# Patient Record
Sex: Female | Born: 1995 | Race: Black or African American | Hispanic: No | Marital: Single | Smoking: Never smoker
Health system: Southern US, Community
[De-identification: ages and names within clinical notes are randomized; demographics above are authoritative.]

## PROBLEM LIST (undated history)

## (undated) ENCOUNTER — Inpatient Hospital Stay (HOSPITAL_COMMUNITY): Payer: Self-pay

## (undated) ENCOUNTER — Emergency Department (HOSPITAL_COMMUNITY): Payer: 59

## (undated) DIAGNOSIS — A609 Anogenital herpesviral infection, unspecified: Secondary | ICD-10-CM

## (undated) DIAGNOSIS — R569 Unspecified convulsions: Secondary | ICD-10-CM

## (undated) HISTORY — PX: WISDOM TOOTH EXTRACTION: SHX21

---
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
## 2015-04-12 ENCOUNTER — Encounter (HOSPITAL_COMMUNITY): Payer: Self-pay | Admitting: *Deleted

## 2015-04-12 ENCOUNTER — Emergency Department (HOSPITAL_COMMUNITY)
Admission: EM | Admit: 2015-04-12 | Discharge: 2015-04-12 | Disposition: A | Discharge status: Home / Self Care | Payer: 59 | Attending: Emergency Medicine | Admitting: Emergency Medicine

## 2015-04-12 DIAGNOSIS — Z8679 Personal history of other diseases of the circulatory system: Secondary | ICD-10-CM | POA: Insufficient documentation

## 2015-04-12 DIAGNOSIS — Y92410 Unspecified street and highway as the place of occurrence of the external cause: Secondary | ICD-10-CM | POA: Diagnosis not present

## 2015-04-12 DIAGNOSIS — M542 Cervicalgia: Secondary | ICD-10-CM

## 2015-04-12 DIAGNOSIS — Y9389 Activity, other specified: Secondary | ICD-10-CM | POA: Insufficient documentation

## 2015-04-12 DIAGNOSIS — S0990XA Unspecified injury of head, initial encounter: Secondary | ICD-10-CM | POA: Diagnosis not present

## 2015-04-12 DIAGNOSIS — Y998 Other external cause status: Secondary | ICD-10-CM | POA: Insufficient documentation

## 2015-04-12 DIAGNOSIS — S199XXA Unspecified injury of neck, initial encounter: Secondary | ICD-10-CM | POA: Diagnosis present
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
## 2015-04-12 NOTE — Discharge Instructions (Signed)
Cervical Sprain °A cervical sprain is an injury in the neck in which the strong, fibrous tissues (ligaments) that connect your neck bones stretch or tear. Cervical sprains can range from mild to severe. Severe cervical sprains can cause the neck vertebrae to be unstable. This can lead to damage of the spinal cord and can result in serious nervous system problems. The amount of time it takes for a cervical sprain to get better depends on the cause and extent of the injury. Most cervical sprains heal in 1 to 3 weeks. °CAUSES  °Severe cervical sprains may be caused by:  °· Contact sport injuries (such as from football, rugby, wrestling, hockey, auto racing, gymnastics, diving, martial arts, or boxing).   °· Motor vehicle collisions.   °· Whiplash injuries. This is an injury from a sudden forward and backward whipping movement of the head and neck.  °· Falls.   °Mild cervical sprains may be caused by:  °· Being in an awkward position, such as while cradling a telephone between your ear and shoulder.   °· Sitting in a chair that does not offer proper support.   °· Working at a poorly designed computer station.   °· Looking up or down for long periods of time.   °SYMPTOMS  °· Pain, soreness, stiffness, or a burning sensation in the front, back, or sides of the neck. This discomfort may develop immediately after the injury or slowly, 24 hours or more after the injury.   °· Pain or tenderness directly in the middle of the back of the neck.   °· Shoulder or upper back pain.   °· Limited ability to move the neck.   °· Headache.   °· Dizziness.   °· Weakness, numbness, or tingling in the hands or arms.   °· Muscle spasms.   °· Difficulty swallowing or chewing.   °· Tenderness and swelling of the neck.   °DIAGNOSIS  °Most of the time your health care provider can diagnose a cervical sprain by taking your history and doing a physical exam. Your health care provider will ask about previous neck injuries and any known neck  problems, such as arthritis in the neck. X-rays may be taken to find out if there are any other problems, such as with the bones of the neck. Other tests, such as a CT scan or MRI, may also be needed.  °TREATMENT  °Treatment depends on the severity of the cervical sprain. Mild sprains can be treated with rest, keeping the neck in place (immobilization), and pain medicines. Severe cervical sprains are immediately immobilized. Further treatment is done to help with pain, muscle spasms, and other symptoms and may include: °· Medicines, such as pain relievers, numbing medicines, or muscle relaxants.   °· Physical therapy. This may involve stretching exercises, strengthening exercises, and posture training. Exercises and improved posture can help stabilize the neck, strengthen muscles, and help stop symptoms from returning.   °HOME CARE INSTRUCTIONS  °· Put ice on the injured area.   °¨ Put ice in a plastic bag.   °¨ Place a towel between your skin and the bag.   °¨ Leave the ice on for 15-20 minutes, 3-4 times a day.   °· If your injury was severe, you may have been given a cervical collar to wear. A cervical collar is a two-piece collar designed to keep your neck from moving while it heals. °¨ Do not remove the collar unless instructed by your health care provider. °¨ If you have long hair, keep it outside of the collar. °¨ Ask your health care provider before making any adjustments to your collar. Minor   adjustments may be required over time to improve comfort and reduce pressure on your chin or on the back of your head.  Ifyou are allowed to remove the collar for cleaning or bathing, follow your health care provider's instructions on how to do so safely.  Keep your collar clean by wiping it with mild soap and water and drying it completely. If the collar you have been given includes removable pads, remove them every 1-2 days and hand wash them with soap and water. Allow them to air dry. They should be completely  dry before you wear them in the collar.  If you are allowed to remove the collar for cleaning and bathing, wash and dry the skin of your neck. Check your skin for irritation or sores. If you see any, tell your health care provider.  Do not drive while wearing the collar.   Only take over-the-counter or prescription medicines for pain, discomfort, or fever as directed by your health care provider.   Keep all follow-up appointments as directed by your health care provider.   Keep all physical therapy appointments as directed by your health care provider.   Make any needed adjustments to your workstation to promote good posture.   Avoid positions and activities that make your symptoms worse.   Warm up and stretch before being active to help prevent problems.  SEEK MEDICAL CARE IF:   Your pain is not controlled with medicine.   You are unable to decrease your pain medicine over time as planned.   Your activity level is not improving as expected.  SEEK IMMEDIATE MEDICAL CARE IF:   You develop any bleeding.  You develop stomach upset.  You have signs of an allergic reaction to your medicine.   Your symptoms get worse.   You develop new, unexplained symptoms.   You have numbness, tingling, weakness, or paralysis in any part of your body.  MAKE SURE YOU:   Understand these instructions.  Will watch your condition.  Will get help right away if you are not doing well or get worse. Document Released: 08/18/2007 Document Revised: 10/26/2013 Document Reviewed: 04/28/2013 [facility] Patient Information 2015 [employer]. This information is not intended to replace advice given to you by your health care provider. Make sure you discuss any questions you have with your health care provider.  Ice, heat, Tylenol, ibuprofen can be used for pain. Please monitor for new or worsening signs or symptoms follow-up immediately if any present. If pain continues to persist past 1  week please follow-up with  and wellness for further evaluation and management

## 2015-04-12 NOTE — ED Notes (Signed)
Pt A&OX4, ambulatory at d/c with steady gait, NAD 

## 2015-04-12 NOTE — ED Notes (Signed)
PA at bedside.

## 2017-11-09 ENCOUNTER — Encounter (HOSPITAL_COMMUNITY): Payer: Self-pay | Admitting: Nurse Practitioner

## 2017-11-09 DIAGNOSIS — R112 Nausea with vomiting, unspecified: Secondary | ICD-10-CM | POA: Insufficient documentation

## 2017-11-09 LAB — COMPREHENSIVE METABOLIC PANEL
ALBUMIN: 4.6 g/dL (ref 3.5–5.0)
ALK PHOS: 63 U/L (ref 38–126)
ALT: 13 U/L — AB (ref 14–54)
AST: 17 U/L (ref 15–41)
Anion gap: 7 (ref 5–15)
BILIRUBIN TOTAL: 1.1 mg/dL (ref 0.3–1.2)
BUN: 7 mg/dL (ref 6–20)
CALCIUM: 9.3 mg/dL (ref 8.9–10.3)
CO2: 24 mmol/L (ref 22–32)
CREATININE: 0.54 mg/dL (ref 0.44–1.00)
Chloride: 107 mmol/L (ref 101–111)
GFR calc Af Amer: >60 mL/min (ref >60)
GFR calc non Af Amer: >60 mL/min (ref >60)
GLUCOSE: 103 mg/dL — AB (ref 65–99)
Potassium: 3.6 mmol/L (ref 3.5–5.1)
Sodium: 138 mmol/L (ref 135–145)
TOTAL PROTEIN: 8 g/dL (ref 6.5–8.1)

## 2017-11-09 LAB — URINALYSIS, ROUTINE W REFLEX MICROSCOPIC
Bilirubin Urine: NEGATIVE
Glucose, UA: NEGATIVE mg/dL
Ketones, ur: 20 mg/dL — AB
Leukocytes, UA: NEGATIVE
NITRITE: NEGATIVE
PH: 5 (ref 5.0–8.0)
Protein, ur: 100 mg/dL — AB
SPECIFIC GRAVITY, URINE: 1.027 (ref 1.005–1.030)

## 2017-11-09 LAB — CBC
HEMATOCRIT: 39 % (ref 36.0–46.0)
Hemoglobin: 13.3 g/dL (ref 12.0–15.0)
MCH: 30.2 pg (ref 26.0–34.0)
MCHC: 34.1 g/dL (ref 30.0–36.0)
MCV: 88.6 fL (ref 78.0–100.0)
PLATELETS: 289 10*3/uL (ref 150–400)
RBC: 4.4 MIL/uL (ref 3.87–5.11)
RDW: 13.9 % (ref 11.5–15.5)
WBC: 13.7 10*3/uL — AB (ref 4.0–10.5)

## 2017-11-09 LAB — I-STAT BETA HCG BLOOD, ED (MC, WL, AP ONLY)

## 2017-11-09 LAB — LIPASE, BLOOD: Lipase: 23 U/L (ref 11–51)

## 2017-11-09 NOTE — ED Triage Notes (Signed)
Pt states she has been vomiting non-stop since this morning. C/o mild abdominal pain.

## 2017-11-10 ENCOUNTER — Emergency Department (HOSPITAL_COMMUNITY)
Admission: EM | Admit: 2017-11-10 | Discharge: 2017-11-10 | Disposition: A | Discharge status: Home / Self Care | Payer: 59 | Attending: Emergency Medicine | Admitting: Emergency Medicine

## 2017-11-10 ENCOUNTER — Other Ambulatory Visit: Payer: Self-pay

## 2017-11-10 DIAGNOSIS — R112 Nausea with vomiting, unspecified: Secondary | ICD-10-CM

## 2017-11-10 MED ORDER — ONDANSETRON 4 MG PO TBDP
4.0000 mg | ORAL_TABLET | Freq: Once | ORAL | Status: AC
  Administered 2017-11-10: 4 mg via ORAL
  Filled 2017-11-10: qty 1

## 2017-11-10 MED ORDER — ONDANSETRON HCL 4 MG PO TABS: 4.0000 mg | ORAL_TABLET | Freq: Four times a day (QID) | ORAL | 0 refills | Status: DC

## 2017-11-10 NOTE — Discharge Instructions (Signed)
Please read attached information. If you experience any new or worsening signs or symptoms please return to the emergency room for evaluation. Please follow-up with your primary care provider or specialist as discussed. Please use medication prescribed only as directed and discontinue taking if you have any concerning signs or symptoms.   °

## 2017-11-10 NOTE — ED Provider Notes (Signed)
[facility]-EMERGENCY DEPT Provider Note   CSN: [id number] Arrival date & time: 11/09/17  1617     History   Chief Complaint Chief Complaint  Patient presents with  . Emesis    HPI Kristen Reese is a 22 y.o. female.  HPI   22 year old female presents today with complaints of nausea and vomiting.  Patient notes she woke up this morning with ongoing vomiting.  She denies any blood, denies any significant abdominal pain, or fever.  Patient notes she is on her menstrual cycle and has having very minor lower abdominal cramping, she notes this is typical of her menstrual cycles.  She denies any diarrhea, denies any abnormal food or drink.  Patient reports she is otherwise healthy.  Unable to tolerate p.o. prior to arrival.   Past Medical History:  Diagnosis Date  . Migraine     There are no active problems to display for this patient.   Past Surgical History:  Procedure Laterality Date  . WISDOM TOOTH EXTRACTION      OB History    Gravida Para Term Preterm AB Living   1 0 0 0 0 0   SAB TAB Ectopic Multiple Live Births   0 0 0 0         Home Medications    Prior to Admission medications   Medication Sig Start Date End Date Taking? Authorizing Provider  ondansetron (ZOFRAN) 4 MG tablet Take 1 tablet (4 mg total) by mouth every 6 (six) hours. 11/10/17   Eyvonne MechanicHedges, Theressa Piedra, PA-C    Family History History reviewed. No pertinent family history.  Social History Social History   Tobacco Use  . Smoking status: Never Smoker  . Smokeless tobacco: Never Used  Substance Use Topics  . Alcohol use: No  . Drug use: No     Allergies   Patient has no known allergies.   Review of Systems Review of Systems  All other systems reviewed and are negative.    Physical Exam Updated Vital Signs BP (!) 136/93 (BP Location: Left Arm)   Pulse 78   Temp 98.4 F (36.9 C) (Oral)   Resp 20   LMP 11/09/2017   SpO2 99%   Physical Exam  Constitutional:  She is oriented to person, place, and time. She appears well-developed and well-nourished.  HENT:  Head: Normocephalic and atraumatic.  Eyes: Conjunctivae are normal. Pupils are equal, round, and reactive to light. Right eye exhibits no discharge. Left eye exhibits no discharge. No scleral icterus.  Neck: Normal range of motion. No JVD present. No tracheal deviation present.  Pulmonary/Chest: Effort normal. No stridor.  Abdominal: Soft. Bowel sounds are normal. She exhibits no distension and no mass. There is no tenderness. There is no rebound and no guarding. No hernia.  Neurological: She is alert and oriented to person, place, and time. Coordination normal.  Psychiatric: She has a normal mood and affect. Her behavior is normal. Judgment and thought content normal.  Nursing note and vitals reviewed.    ED Treatments / Results  Labs (all labs ordered are listed, but only abnormal results are displayed) Labs Reviewed  COMPREHENSIVE METABOLIC PANEL - Abnormal; Notable for the following components:      Result Value   Glucose, Bld 103 (*)    ALT 13 (*)    All other components within normal limits  CBC - Abnormal; Notable for the following components:   WBC 13.7 (*)    All other components within normal limits  URINALYSIS, ROUTINE W REFLEX MICROSCOPIC - Abnormal; Notable for the following components:   Hgb urine dipstick LARGE (*)    Ketones, ur 20 (*)    Protein, ur 100 (*)    Bacteria, UA MANY (*)    Squamous Epithelial / LPF 0-5 (*)    All other components within normal limits  LIPASE, BLOOD  I-STAT BETA HCG BLOOD, ED ([facility], [facility], [facility] ONLY)    EKG  EKG Interpretation None       Radiology No results found.  Procedures Procedures (including critical care time)  Medications Ordered in ED Medications  ondansetron (ZOFRAN-ODT) disintegrating tablet 4 mg (4 mg Oral Given 11/10/17 0131)     Initial Impression / Assessment and Plan / ED Course  I have reviewed the triage vital  signs and the nursing notes.  Pertinent labs & imaging results that were available during my care of the patient were reviewed by me and considered in my medical decision making (see chart for details).     Final Clinical Impressions(s) / ED Diagnoses   Final diagnoses:  Non-intractable vomiting with nausea, unspecified vomiting type    Labs:   Imaging:  Consults:  Therapeutics: Zofran  Discharge Meds: Zofran  Assessment/Plan: 22 year old female presents today with nausea and vomiting.  She is very well-appearing in no acute distress.  She has soft benign nontender abdomen with no signs of significant infection.  Patient tolerating p.o., low suspicion for significant intra-abdominal pathology.  She will follow-up immediately if any new or worsening signs or symptoms present.  Patient verbalized understanding and agreement to today's plan had no further questions or concerns.      ED Discharge Orders        Ordered    ondansetron (ZOFRAN) 4 MG tablet  Every 6 hours     11/10/17 0206       Eyvonne Mechanic, PA-C 11/10/17 0208    Molpus, Jonny Ruiz, MD 11/10/17 [phone]

## 2017-11-10 NOTE — ED Notes (Signed)
Bed: WA19 Expected date:  Expected time:  Means of arrival:  Comments: 

## 2017-12-16 ENCOUNTER — Other Ambulatory Visit: Payer: Self-pay | Admitting: Family Medicine

## 2017-12-16 DIAGNOSIS — Z8669 Personal history of other diseases of the nervous system and sense organs: Secondary | ICD-10-CM

## 2017-12-23 ENCOUNTER — Encounter (HOSPITAL_COMMUNITY): Payer: Self-pay | Admitting: Emergency Medicine

## 2017-12-23 ENCOUNTER — Ambulatory Visit (HOSPITAL_COMMUNITY)
Admission: EM | Admit: 2017-12-23 | Discharge: 2017-12-23 | Disposition: A | Discharge status: Home / Self Care | Payer: 59 | Attending: Internal Medicine | Admitting: Internal Medicine

## 2017-12-23 DIAGNOSIS — R1084 Generalized abdominal pain: Secondary | ICD-10-CM

## 2017-12-23 DIAGNOSIS — R112 Nausea with vomiting, unspecified: Secondary | ICD-10-CM | POA: Diagnosis not present

## 2017-12-23 DIAGNOSIS — Z3202 Encounter for pregnancy test, result negative: Secondary | ICD-10-CM

## 2017-12-23 DIAGNOSIS — N946 Dysmenorrhea, unspecified: Secondary | ICD-10-CM | POA: Diagnosis not present

## 2017-12-23 LAB — POCT URINALYSIS DIP (DEVICE)
GLUCOSE, UA: NEGATIVE mg/dL
KETONES UR: 40 mg/dL — AB
LEUKOCYTES UA: NEGATIVE
Nitrite: NEGATIVE
Protein, ur: >=300 mg/dL — AB
Urobilinogen, UA: 0.2 mg/dL (ref 0.0–1.0)
pH: 5.5 (ref 5.0–8.0)

## 2017-12-23 LAB — POCT PREGNANCY, URINE
PREG TEST UR: NEGATIVE
Preg Test, Ur: NEGATIVE

## 2017-12-23 MED ORDER — ONDANSETRON 4 MG PO TBDP
4.0000 mg | ORAL_TABLET | Freq: Once | ORAL | Status: AC
  Administered 2017-12-23: 4 mg via ORAL

## 2017-12-23 MED ORDER — ONDANSETRON 4 MG PO TBDP
ORAL_TABLET | ORAL | Status: AC
  Filled 2017-12-23: qty 1

## 2017-12-23 MED ORDER — KETOROLAC TROMETHAMINE 60 MG/2ML IM SOLN
INTRAMUSCULAR | Status: AC
  Filled 2017-12-23: qty 2

## 2017-12-23 MED ORDER — ONDANSETRON HCL 4 MG PO TABS: 4.0000 mg | ORAL_TABLET | Freq: Three times a day (TID) | ORAL | 0 refills | Status: DC | PRN

## 2017-12-23 MED ORDER — KETOROLAC TROMETHAMINE 60 MG/2ML IM SOLN
60.0000 mg | Freq: Once | INTRAMUSCULAR | Status: AC
  Administered 2017-12-23: 60 mg via INTRAMUSCULAR

## 2017-12-23 NOTE — Discharge Instructions (Signed)
Increase fluid intake tonight to ensure adequate hydration, gatorade or pedialyte will likely be beneficial for you. If symptoms remain controled tomorrow may advance to bland diet as tolerated.  Zofran every 8 hours as needed. Ibuprofen as needed for pain, do not take another dose for 6 hours from now. Please continue to follow with your primary care provider and/or gynecology as already scheduled. If worsening of symptoms or dehydration return to be seen or go to Er.

## 2017-12-23 NOTE — ED Notes (Signed)
Pt sts that she does not have to urinate do to N/V.  Pt given fluids to encourage urination.

## 2017-12-23 NOTE — ED Triage Notes (Addendum)
PT reports nausea and vomiting that started today at 4am. PT reports at least ten episodes. PT reports weakness as well.  PT is on her menstrual, this happened last month as well. PT plans to see OBGYN next week

## 2017-12-23 NOTE — ED Provider Notes (Signed)
[facility]    CSN: [id number] Arrival date & time: 12/23/17  1506     History   Chief Complaint Chief Complaint  Patient presents with  . Emesis    HPI Kristen Reese is a 22 y.o. female.   Kristen Reese presents with complaints of nausea and vomiting which started suddenly this morning at 0400. She states she has vomited >10 times today. Has not been able to drink. Has been sipping fluids but has had had vomiting following. Urinating. Abdominal cramping associated with her period which started 2/16. She states she had similar presentation with her period last month, was seen in ER 1/7. Symptoms improved with zofran. She states at that time she thought it was viral illness, but now she feels it may be related to her period. 10/10 cramping. Has not been able to take any medications for pain. No known fevers or known ill contacts. Her periods are regular. Denies diarrhea. Denies dizziness. Feels weak. Denies vaginal discharge or symptoms. Denies urinary symptoms. No known ill contacts.      ROS per HPI.       Past Medical History:  Diagnosis Date  . Migraine     There are no active problems to display for this patient.   Past Surgical History:  Procedure Laterality Date  . WISDOM TOOTH EXTRACTION      OB History    Gravida Para Term Preterm AB Living   1 0 0 0 0 0   SAB TAB Ectopic Multiple Live Births   0 0 0 0         Home Medications    Prior to Admission medications   Medication Sig Start Date End Date Taking? Authorizing Provider  ondansetron (ZOFRAN) 4 MG tablet Take 1 tablet (4 mg total) by mouth every 8 (eight) hours as needed for nausea or vomiting. 12/23/17   Georgetta Haber, NP    Family History History reviewed. No pertinent family history.  Social History Social History   Tobacco Use  . Smoking status: Never Smoker  . Smokeless tobacco: Never Used  Substance Use Topics  . Alcohol use: No  . Drug use: No     Allergies     Patient has no known allergies.   Review of Systems Review of Systems   Physical Exam Triage Vital Signs ED Triage Vitals  Enc Vitals Group     BP 12/23/17 1601 125/82     Pulse Rate 12/23/17 1600 60     Resp 12/23/17 1600 16     Temp 12/23/17 1600 98.3 F (36.8 C)     Temp Source 12/23/17 1600 Oral     SpO2 12/23/17 1600 97 %     Weight 12/23/17 1601 131 lb (59.4 kg)     Height 12/23/17 1601 5' (1.524 m)     Head Circumference --      Peak Flow --      Pain Score 12/23/17 1601 9     Pain Loc --      Pain Edu? --      Excl. in GC? --    No data found.  Updated Vital Signs BP 125/82   Pulse 60   Temp 98.3 F (36.8 C) (Oral)   Resp 16   Ht 5' (1.524 m)   Wt 131 lb (59.4 kg)   LMP 12/20/2017   SpO2 97%   BMI 25.58 kg/m   Visual Acuity Right Eye Distance:   Left Eye Distance:  Bilateral Distance:    Right Eye Near:   Left Eye Near:    Bilateral Near:     Physical Exam  Constitutional: She is oriented to person, place, and time. She appears well-developed and well-nourished. No distress.  Cardiovascular: Normal rate, regular rhythm and normal heart sounds.  Pulmonary/Chest: Effort normal and breath sounds normal.  Abdominal: Soft. There is generalized tenderness. There is no rigidity, no rebound, no guarding, no CVA tenderness, no tenderness at McBurney's point and negative Murphy's sign.  Mild generalized abdominal pain; upper abdomen has been sore since vomiting; mild tenderness to low abdomen associated with cramping  Neurological: She is alert and oriented to person, place, and time.  Skin: Skin is warm and dry.     UC Treatments / Results  Labs (all labs ordered are listed, but only abnormal results are displayed) Labs Reviewed  POCT URINALYSIS DIP (DEVICE) - Abnormal; Notable for the following components:      Result Value   Bilirubin Urine SMALL (*)    Ketones, ur 40 (*)    Hgb urine dipstick LARGE (*)    Protein, ur >=300 (*)    All other  components within normal limits  POCT PREGNANCY, URINE  POCT PREGNANCY, URINE    EKG  EKG Interpretation None       Radiology No results found.  Procedures Procedures (including critical care time)  Medications Ordered in UC Medications  ketorolac (TORADOL) injection 60 mg (60 mg Intramuscular Given 12/23/17 1709)  ondansetron (ZOFRAN-ODT) disintegrating tablet 4 mg (4 mg Oral Given 12/23/17 1709)     Initial Impression / Assessment and Plan / UC Course  I have reviewed the triage vital signs and the nursing notes.  Pertinent labs & imaging results that were available during my care of the patient were reviewed by me and considered in my medical decision making (see chart for details).     Pain and nausea improved s/p toradol and zofran. Non specific findings on exam, low suspicion for acute abdomen. . Non toxic in appearance. Tolerated water intake after zofran provided. Encouraged increased fluid intake. Motrin for pain control. Continue to follow with PCP and gynecology as she has already scheduled. Return precautions provided. Patient verbalized understanding and agreeable to plan.    Final Clinical Impressions(s) / UC Diagnoses   Final diagnoses:  Non-intractable vomiting with nausea, unspecified vomiting type  Menstrual cramps    ED Discharge Orders        Ordered    ondansetron (ZOFRAN) 4 MG tablet  Every 8 hours PRN     12/23/17 1717       Controlled Substance Prescriptions [place] Controlled Substance Registry consulted? Not Applicable   Georgetta HaberBurky, Natalie B, NP 12/23/17 1742

## 2018-01-14 ENCOUNTER — Ambulatory Visit
Admission: RE | Admit: 2018-01-14 | Discharge: 2018-01-14 | Disposition: A | Discharge status: Home / Self Care | Payer: 59 | Source: Ambulatory Visit | Attending: Family Medicine | Admitting: Family Medicine

## 2018-01-14 ENCOUNTER — Other Ambulatory Visit: Payer: Self-pay | Admitting: Family Medicine

## 2018-01-14 DIAGNOSIS — R112 Nausea with vomiting, unspecified: Secondary | ICD-10-CM

## 2018-01-14 DIAGNOSIS — R109 Unspecified abdominal pain: Secondary | ICD-10-CM

## 2018-09-07 ENCOUNTER — Inpatient Hospital Stay (HOSPITAL_COMMUNITY)
Admission: AD | Admit: 2018-09-07 | Discharge: 2018-09-07 | Disposition: A | Discharge status: Home / Self Care | Payer: 59 | Source: Ambulatory Visit | Attending: Obstetrics and Gynecology | Admitting: Obstetrics and Gynecology

## 2018-09-07 ENCOUNTER — Other Ambulatory Visit: Payer: Self-pay

## 2018-09-07 ENCOUNTER — Encounter (HOSPITAL_COMMUNITY): Payer: Self-pay | Admitting: *Deleted

## 2018-09-07 DIAGNOSIS — Z3200 Encounter for pregnancy test, result unknown: Secondary | ICD-10-CM

## 2018-09-07 DIAGNOSIS — R1110 Vomiting, unspecified: Secondary | ICD-10-CM | POA: Diagnosis present

## 2018-09-07 DIAGNOSIS — N914 Secondary oligomenorrhea: Secondary | ICD-10-CM

## 2018-09-07 NOTE — MAU Note (Signed)
Here for pregnancy test, Pt stopped her BCP and had a light cycle 10/1-10/6 Had vomited yesterday Some cramps not present today Advised this is not covered or indicated service at the hospital Pt does not appear in acute distress Pt instructed to go to the office for lab test Office called and appt made for 8 am

## 2018-09-07 NOTE — MAU Note (Signed)
Was on vacation this weekend, was vomiting a lot. Did 2 preg tests when she got home and they were both positive.  Just wanting to verify. Feeling ok this morning. (verified Dr Cherly Hensen is her physician)

## 2018-10-02 ENCOUNTER — Other Ambulatory Visit: Payer: Self-pay

## 2018-10-02 ENCOUNTER — Encounter (HOSPITAL_COMMUNITY): Payer: Self-pay | Admitting: *Deleted

## 2018-10-02 ENCOUNTER — Inpatient Hospital Stay (HOSPITAL_COMMUNITY)
Admission: AD | Admit: 2018-10-02 | Discharge: 2018-10-02 | Disposition: A | Discharge status: Home / Self Care | Payer: 59 | Source: Ambulatory Visit | Attending: Obstetrics & Gynecology | Admitting: Obstetrics & Gynecology

## 2018-10-02 DIAGNOSIS — O219 Vomiting of pregnancy, unspecified: Secondary | ICD-10-CM | POA: Insufficient documentation

## 2018-10-02 DIAGNOSIS — E860 Dehydration: Secondary | ICD-10-CM | POA: Diagnosis not present

## 2018-10-02 DIAGNOSIS — R1110 Vomiting, unspecified: Secondary | ICD-10-CM | POA: Diagnosis present

## 2018-10-02 DIAGNOSIS — Z3A08 8 weeks gestation of pregnancy: Secondary | ICD-10-CM | POA: Diagnosis not present

## 2018-10-02 DIAGNOSIS — O99281 Endocrine, nutritional and metabolic diseases complicating pregnancy, first trimester: Secondary | ICD-10-CM | POA: Diagnosis not present

## 2018-10-02 LAB — URINALYSIS, ROUTINE W REFLEX MICROSCOPIC
BACTERIA UA: NONE SEEN
BILIRUBIN URINE: NEGATIVE
Glucose, UA: NEGATIVE mg/dL
Hgb urine dipstick: NEGATIVE
KETONES UR: 80 mg/dL — AB
Nitrite: NEGATIVE
Protein, ur: 100 mg/dL — AB
Specific Gravity, Urine: 1.033 — ABNORMAL HIGH (ref 1.005–1.030)
pH: 5 (ref 5.0–8.0)

## 2018-10-02 LAB — POCT PREGNANCY, URINE: PREG TEST UR: POSITIVE — AB

## 2018-10-02 MED ORDER — PROMETHAZINE HCL 12.5 MG PO TABS: 25.0000 mg | ORAL_TABLET | Freq: Four times a day (QID) | ORAL | 0 refills | Status: DC | PRN

## 2018-10-02 MED ORDER — LACTATED RINGERS IV BOLUS
1000.0000 mL | Freq: Once | INTRAVENOUS | Status: AC
  Administered 2018-10-02: 1000 mL via INTRAVENOUS

## 2018-10-02 MED ORDER — PROMETHAZINE HCL 25 MG/ML IJ SOLN
12.5000 mg | Freq: Once | INTRAMUSCULAR | Status: AC
  Administered 2018-10-02: 12.5 mg via INTRAVENOUS
  Filled 2018-10-02: qty 1

## 2018-10-02 MED ORDER — DOXYLAMINE-PYRIDOXINE 10-10 MG PO TBEC: DELAYED_RELEASE_TABLET | ORAL | 3 refills | Status: DC

## 2018-10-02 MED ORDER — ONDANSETRON HCL 4 MG/2ML IJ SOLN
4.0000 mg | Freq: Once | INTRAMUSCULAR | Status: AC
  Administered 2018-10-02: 4 mg via INTRAVENOUS
  Filled 2018-10-02: qty 2

## 2018-10-02 NOTE — MAU Note (Signed)
Can't stop vomiting, been vomiting since Monday.  Can't keep anything down. Not on any meds.  Has been seen in office for preg.

## 2018-10-02 NOTE — MAU Provider Note (Signed)
First Provider Initiated Contact with Patient 10/02/18 1242      Chief Complaint:  Emesis   Kristen Reese is  22 y.o. G1P0000 at [redacted]w[redacted]d presents complaining of Emesis   Has been vomiting since MOnday, 6-7 times a day. Has taken OTC antiemetics w/o relief.   Obstetrical/Gynecological History: OB History    Gravida  1   Para  0   Term  0   Preterm  0   AB  0   Living  0     SAB  0   TAB  0   Ectopic  0   Multiple  0   Live Births             Past Medical History: Past Medical History:  Diagnosis Date  . Migraine     Past Surgical History: Past Surgical History:  Procedure Laterality Date  . WISDOM TOOTH EXTRACTION      Family History: History reviewed. No pertinent family history.  Social History: Social History   Tobacco Use  . Smoking status: Never Smoker  . Smokeless tobacco: Never Used  Substance Use Topics  . Alcohol use: No  . Drug use: No    Allergies: No Known Allergies  Meds:  Medications Prior to Admission  Medication Sig Dispense Refill Last Dose  . ondansetron (ZOFRAN) 4 MG tablet Take 1 tablet (4 mg total) by mouth every 8 (eight) hours as needed for nausea or vomiting. 10 tablet 0 More than a month at Unknown time    Review of Systems   Constitutional: Negative for fever and chills Eyes: Negative for visual disturbances Respiratory: Negative for shortness of breath, dyspnea Cardiovascular: Negative for chest pain or palpitations  Gastrointestinal: Negative for vomiting, diarrhea and constipation Genitourinary: Negative for dysuria and urgency Musculoskeletal: Negative for back pain, joint pain, myalgias.  Normal ROM  Neurological: Negative for dizziness and headaches    Physical Exam  Blood pressure 123/83, pulse 95, temperature 97.6 F (36.4 C), temperature source Oral, resp. rate 16, weight 56.7 kg, last menstrual period 08/04/2018, SpO2 100 %, unknown if currently breastfeeding. GENERAL: Well-developed,  well-nourished female in no acute distress.  LUNGS: Normal respiratory effort HEART: Regular rate and rhythm. ABDOMEN: Soft, nontender, nondistended EXTREMITIES: Nontender, no edema, 2+ distal pulses. DTR's 2+   Labs: Results for orders placed or performed during the hospital encounter of 10/02/18 (from the past 24 hour(s))  Urinalysis, Routine w reflex microscopic   Collection Time: 10/02/18 12:18 PM  Result Value Ref Range   Color, Urine Demetrica (A) YELLOW   APPearance CLOUDY (A) CLEAR   Specific Gravity, Urine 1.033 (H) 1.005 - 1.030   pH 5.0 5.0 - 8.0   Glucose, UA NEGATIVE NEGATIVE mg/dL   Hgb urine dipstick NEGATIVE NEGATIVE   Bilirubin Urine NEGATIVE NEGATIVE   Ketones, ur 80 (A) NEGATIVE mg/dL   Protein, ur 161 (A) NEGATIVE mg/dL   Nitrite NEGATIVE NEGATIVE   Leukocytes, UA TRACE (A) NEGATIVE   RBC / HPF 6-10 0 - 5 RBC/hpf   WBC, UA 6-10 0 - 5 WBC/hpf   Bacteria, UA NONE SEEN NONE SEEN   Squamous Epithelial / LPF 21-50 0 - 5   Mucus PRESENT   Pregnancy, urine POC   Collection Time: 10/02/18 12:19 PM  Result Value Ref Range   Preg Test, Ur POSITIVE (A) NEGATIVE   Imaging Studies:  No results found.  Assessment: Kristen Reese is  22 y.o. G1P0000 at [redacted]w[redacted]d presents with N/V/Dehydration..  Plan:  IVF, IV phenergan and zofran X1.  Tolerated PO well, feels much better. rx PO phenergan and Diclegis (home-made instructions given in case it costs to much)  Jacklyn ShellFrances Cresenzo-Dishmon 11/29/20194:23 PM

## 2018-10-04 ENCOUNTER — Other Ambulatory Visit: Payer: Self-pay

## 2018-10-04 ENCOUNTER — Inpatient Hospital Stay (HOSPITAL_COMMUNITY)
Admission: AD | Admit: 2018-10-04 | Discharge: 2018-10-04 | Disposition: A | Discharge status: Home / Self Care | Payer: 59 | Attending: Obstetrics and Gynecology | Admitting: Obstetrics and Gynecology

## 2018-10-04 ENCOUNTER — Encounter (HOSPITAL_COMMUNITY): Payer: Self-pay | Admitting: *Deleted

## 2018-10-04 DIAGNOSIS — Z3A08 8 weeks gestation of pregnancy: Secondary | ICD-10-CM | POA: Insufficient documentation

## 2018-10-04 DIAGNOSIS — O210 Mild hyperemesis gravidarum: Secondary | ICD-10-CM | POA: Diagnosis not present

## 2018-10-04 LAB — COMPREHENSIVE METABOLIC PANEL
ALT: 15 U/L (ref 0–44)
AST: 15 U/L (ref 15–41)
Albumin: 4.1 g/dL (ref 3.5–5.0)
Alkaline Phosphatase: 36 U/L — ABNORMAL LOW (ref 38–126)
Anion gap: 10 (ref 5–15)
BUN: 6 mg/dL (ref 6–20)
CO2: 24 mmol/L (ref 22–32)
Calcium: 9.1 mg/dL (ref 8.9–10.3)
Chloride: 101 mmol/L (ref 98–111)
Creatinine, Ser: 0.51 mg/dL (ref 0.44–1.00)
GFR calc Af Amer: >60 mL/min (ref >60)
Glucose, Bld: 98 mg/dL (ref 70–99)
Potassium: 3.4 mmol/L — ABNORMAL LOW (ref 3.5–5.1)
Sodium: 135 mmol/L (ref 135–145)
Total Bilirubin: 1.9 mg/dL — ABNORMAL HIGH (ref 0.3–1.2)
Total Protein: 6.8 g/dL (ref 6.5–8.1)

## 2018-10-04 LAB — URINALYSIS, ROUTINE W REFLEX MICROSCOPIC
Bilirubin Urine: NEGATIVE
Glucose, UA: NEGATIVE mg/dL
Hgb urine dipstick: NEGATIVE
Ketones, ur: 80 mg/dL — AB
LEUKOCYTES UA: NEGATIVE
Nitrite: NEGATIVE
Protein, ur: 30 mg/dL — AB
Specific Gravity, Urine: 1.02 (ref 1.005–1.030)
pH: 9 — ABNORMAL HIGH (ref 5.0–8.0)

## 2018-10-04 LAB — CBC
HCT: 33.4 % — ABNORMAL LOW (ref 36.0–46.0)
Hemoglobin: 11.3 g/dL — ABNORMAL LOW (ref 12.0–15.0)
MCH: 30.2 pg (ref 26.0–34.0)
MCHC: 33.8 g/dL (ref 30.0–36.0)
MCV: 89.3 fL (ref 80.0–100.0)
PLATELETS: 252 10*3/uL (ref 150–400)
RBC: 3.74 MIL/uL — ABNORMAL LOW (ref 3.87–5.11)
RDW: 13.5 % (ref 11.5–15.5)
WBC: 11.6 10*3/uL — ABNORMAL HIGH (ref 4.0–10.5)

## 2018-10-04 MED ORDER — PROMETHAZINE HCL 25 MG RE SUPP: 25.0000 mg | Freq: Four times a day (QID) | RECTAL | 1 refills | Status: DC | PRN

## 2018-10-04 MED ORDER — M.V.I. ADULT IV INJ
Freq: Once | INTRAVENOUS | Status: AC
  Administered 2018-10-04: 04:00:00 via INTRAVENOUS
  Filled 2018-10-04: qty 1000

## 2018-10-04 MED ORDER — PROMETHAZINE HCL 25 MG/ML IJ SOLN
25.0000 mg | Freq: Once | INTRAVENOUS | Status: AC
  Administered 2018-10-04: 25 mg via INTRAVENOUS
  Filled 2018-10-04: qty 1

## 2018-10-04 NOTE — Discharge Instructions (Signed)
See your doctor this week as follow up. Take Vitamin B6 50 mg and Unisom 30 mg by mouth at bedtime. Keep taking sips of liquids throughout the day and add foods as you can tolerate - try bananas, rice with no butter, toast with no butter and applesauce. Get your prescription for suppositories and use the suppositories rather than the phenergan by mouth if you are vomiting. Try to eat about 40 minutes after the Phenergan tablet or suppositories.

## 2018-10-04 NOTE — MAU Note (Signed)
Pt presents to MAU c/o vomiting x 5 times today. Pt reports lower abdominal cramping when she vomits that is a 7/10. No bleeding or discharge.

## 2018-10-04 NOTE — MAU Provider Note (Addendum)
History     CSN: [id number]  Arrival date and time: 10/04/18 0141   First Provider Initiated Contact with Patient 10/04/18 0214      Chief Complaint  Patient presents with  . Emesis   HPI Kristen Reese 22 y.o. 531w5d  Comes to MAU with continued vomiting in pregnancy.  Was seen in MAU on 10-02-18 but she vomits the phenergan tablets shortly after taking them.  Has not been able to keep down food or fluids since she was here.  Did not realize she could use the phenergan tablets vaginally.  Went to the store to get Vitamin B6 and unisom and was sick again before she could get them.  Has been [facility] in [facility].  Vomited 5 times on Saturday.  OB History    Gravida  1   Para  0   Term  0   Preterm  0   AB  0   Living  0     SAB  0   TAB  0   Ectopic  0   Multiple  0   Live Births              Past Medical History:  Diagnosis Date  . Migraine     Past Surgical History:  Procedure Laterality Date  . WISDOM TOOTH EXTRACTION      No family history on file.  Social History   Tobacco Use  . Smoking status: Never Smoker  . Smokeless tobacco: Never Used  Substance Use Topics  . Alcohol use: No  . Drug use: No    Allergies: No Known Allergies  Medications Prior to Admission  Medication Sig Dispense Refill Last Dose  . promethazine (PHENERGAN) 12.5 MG tablet Take 2 tablets (25 mg total) by mouth every 6 (six) hours as needed for nausea or vomiting. 30 tablet 0 10/03/2018 at Unknown time  . Doxylamine-Pyridoxine 10-10 MG TBEC 2 PO qhs; may take 1po in am and 1po in afternoon prn nausea 120 tablet 3     Review of Systems  Constitutional: Negative for fever.  Gastrointestinal: Positive for abdominal pain, nausea and vomiting.  Genitourinary: Negative for dysuria, vaginal bleeding and vaginal discharge.  Neurological: Positive for weakness.   Physical Exam   Blood pressure (!) 121/93, pulse 99, temperature 98.2 F (36.8 C),  temperature source Oral, resp. rate 16, height 5' (1.524 m), weight 55.8 kg, last menstrual period 08/04/2018, unknown if currently breastfeeding.  Physical Exam  Nursing note and vitals reviewed. Constitutional: She is oriented to person, place, and time. She appears well-developed and well-nourished.  HENT:  Head: Normocephalic.  Eyes: EOM are normal.  Neck: Neck supple.  GI: Soft. There is no tenderness. There is no rebound and no guarding.  Pain is above the umbilicus in the upper quadrants  Musculoskeletal: Normal range of motion.  Neurological: She is alert and oriented to person, place, and time.  Skin: Skin is warm and dry.  Psychiatric: She has a normal mood and affect.    MAU Course  Procedures Results for orders placed or performed during the hospital encounter of 10/04/18 (from the past 24 hour(s))  Urinalysis, Routine w reflex microscopic     Status: Abnormal   Collection Time: 10/04/18  2:21 AM  Result Value Ref Range   Color, Urine YELLOW YELLOW   APPearance TURBID (A) CLEAR   Specific Gravity, Urine 1.020 1.005 - 1.030   pH 9.0 (H) 5.0 - 8.0  Glucose, UA NEGATIVE NEGATIVE mg/dL   Hgb urine dipstick NEGATIVE NEGATIVE   Bilirubin Urine NEGATIVE NEGATIVE   Ketones, ur 80 (A) NEGATIVE mg/dL   Protein, ur 30 (A) NEGATIVE mg/dL   Nitrite NEGATIVE NEGATIVE   Leukocytes, UA NEGATIVE NEGATIVE   WBC, UA 0-5 0 - 5 WBC/hpf   Bacteria, UA RARE (A) NONE SEEN   Squamous Epithelial / LPF 11-20 0 - 5   Mucus PRESENT    Amorphous Crystal PRESENT   CBC     Status: Abnormal   Collection Time: 10/04/18  2:36 AM  Result Value Ref Range   WBC 11.6 (H) 4.0 - 10.5 K/uL   RBC 3.74 (L) 3.87 - 5.11 MIL/uL   Hemoglobin 11.3 (L) 12.0 - 15.0 g/dL   HCT 16.1 (L) 09.6 - 04.5 %   MCV 89.3 80.0 - 100.0 fL   MCH 30.2 26.0 - 34.0 pg   MCHC 33.8 30.0 - 36.0 g/dL   RDW 40.9 81.1 - 91.4 %   Platelets 252 150 - 400 K/uL  Comprehensive metabolic panel     Status: Abnormal   Collection  Time: 10/04/18  2:36 AM  Result Value Ref Range   Sodium 135 135 - 145 mmol/L   Potassium 3.4 (L) 3.5 - 5.1 mmol/L   Chloride 101 98 - 111 mmol/L   CO2 24 22 - 32 mmol/L   Glucose, Bld 98 70 - 99 mg/dL   BUN 6 6 - 20 mg/dL   Creatinine, Ser 7.82 0.44 - 1.00 mg/dL   Calcium 9.1 8.9 - 95.6 mg/dL   Total Protein 6.8 6.5 - 8.1 g/dL   Albumin 4.1 3.5 - 5.0 g/dL   AST 15 15 - 41 U/L   ALT 15 0 - 44 U/L   Alkaline Phosphatase 36 (L) 38 - 126 U/L   Total Bilirubin 1.9 (H) 0.3 - 1.2 mg/dL   GFR calc non Af Amer >60 >60 mL/min   GFR calc Af Amer >60 >60 mL/min   Anion gap 10 5 - 15    MDM Discussed at length how to take her medicines and the importance of keeping some food in her stomach.  Advised to keep trying different kinds of foods - reviewed the BRAT diet.  Discussed alternate ways of taking the phenergan so it does get in her system - using the tablets in the vagina. Given 1000 cc LR with phenergan 24 mg and D5LR 1000 cc with multivitamins.  Assessment and Plan  Hyperemesis not controlled with medication  Plan Call the office and be seen this week as a follow up. Will prescribe phenergan suppositories to use as well. Be sure to eat about 30 -40 minutes after taking the medication as that is the best opportunity for keeping food down. Advised to use Vitamin B6 and Unisom 30 mg PO at bedtime in addition to the phenergan. Able to take ice chips in MAU - no vomiting seen in MAU.  Tallan Sandoz L Lavonya Hoerner 10/04/2018, 2:25 AM

## 2018-10-04 NOTE — MAU Note (Signed)
Pt states she found a ride home and walked out of the unit. Pt informed to not drive while taking phenergan. Pt states understanding.

## 2018-11-13 LAB — OB RESULTS CONSOLE HIV ANTIBODY (ROUTINE TESTING): HIV: NONREACTIVE

## 2018-11-13 LAB — OB RESULTS CONSOLE GC/CHLAMYDIA
Chlamydia: NEGATIVE
Gonorrhea: NEGATIVE

## 2018-11-13 LAB — OB RESULTS CONSOLE HEPATITIS B SURFACE ANTIGEN: Hepatitis B Surface Ag: NEGATIVE

## 2018-11-13 LAB — OB RESULTS CONSOLE RPR: RPR: NONREACTIVE

## 2018-11-13 LAB — OB RESULTS CONSOLE ANTIBODY SCREEN: Antibody Screen: NEGATIVE

## 2018-11-13 LAB — OB RESULTS CONSOLE ABO/RH: RH Type: POSITIVE

## 2018-11-13 LAB — OB RESULTS CONSOLE RUBELLA ANTIBODY, IGM: Rubella: IMMUNE

## 2018-11-27 ENCOUNTER — Other Ambulatory Visit: Payer: Self-pay

## 2018-11-27 ENCOUNTER — Inpatient Hospital Stay (HOSPITAL_COMMUNITY)
Admission: AD | Admit: 2018-11-27 | Discharge: 2018-11-27 | Disposition: A | Discharge status: Home / Self Care | Payer: 59 | Attending: Obstetrics & Gynecology | Admitting: Obstetrics & Gynecology

## 2018-11-27 ENCOUNTER — Encounter (HOSPITAL_COMMUNITY): Payer: Self-pay

## 2018-11-27 DIAGNOSIS — O99019 Anemia complicating pregnancy, unspecified trimester: Secondary | ICD-10-CM

## 2018-11-27 DIAGNOSIS — Z3A16 16 weeks gestation of pregnancy: Secondary | ICD-10-CM

## 2018-11-27 DIAGNOSIS — O99012 Anemia complicating pregnancy, second trimester: Secondary | ICD-10-CM | POA: Insufficient documentation

## 2018-11-27 DIAGNOSIS — D649 Anemia, unspecified: Secondary | ICD-10-CM | POA: Diagnosis not present

## 2018-11-27 DIAGNOSIS — O26892 Other specified pregnancy related conditions, second trimester: Secondary | ICD-10-CM | POA: Diagnosis not present

## 2018-11-27 DIAGNOSIS — R55 Syncope and collapse: Secondary | ICD-10-CM | POA: Insufficient documentation

## 2018-11-27 HISTORY — DX: Unspecified convulsions: R56.9

## 2018-11-27 LAB — COMPREHENSIVE METABOLIC PANEL
ALT: 10 U/L (ref 0–44)
ANION GAP: 6 (ref 5–15)
AST: 13 U/L — ABNORMAL LOW (ref 15–41)
Albumin: 3.5 g/dL (ref 3.5–5.0)
Alkaline Phosphatase: 37 U/L — ABNORMAL LOW (ref 38–126)
BUN: 6 mg/dL (ref 6–20)
CO2: 24 mmol/L (ref 22–32)
Calcium: 8.7 mg/dL — ABNORMAL LOW (ref 8.9–10.3)
Chloride: 105 mmol/L (ref 98–111)
Creatinine, Ser: 0.43 mg/dL — ABNORMAL LOW (ref 0.44–1.00)
GFR calc Af Amer: >60 mL/min (ref >60)
GFR calc non Af Amer: >60 mL/min (ref >60)
Glucose, Bld: 108 mg/dL — ABNORMAL HIGH (ref 70–99)
Potassium: 3.7 mmol/L (ref 3.5–5.1)
Sodium: 135 mmol/L (ref 135–145)
Total Bilirubin: 0.6 mg/dL (ref 0.3–1.2)
Total Protein: 6 g/dL — ABNORMAL LOW (ref 6.5–8.1)

## 2018-11-27 LAB — URINALYSIS, ROUTINE W REFLEX MICROSCOPIC
Bilirubin Urine: NEGATIVE
Glucose, UA: NEGATIVE mg/dL
Hgb urine dipstick: NEGATIVE
Ketones, ur: NEGATIVE mg/dL
Nitrite: NEGATIVE
PH: 6 (ref 5.0–8.0)
Protein, ur: NEGATIVE mg/dL
Specific Gravity, Urine: 1.003 — ABNORMAL LOW (ref 1.005–1.030)

## 2018-11-27 LAB — CBC
HCT: 30.3 % — ABNORMAL LOW (ref 36.0–46.0)
Hemoglobin: 10.1 g/dL — ABNORMAL LOW (ref 12.0–15.0)
MCH: 30.2 pg (ref 26.0–34.0)
MCHC: 33.3 g/dL (ref 30.0–36.0)
MCV: 90.7 fL (ref 80.0–100.0)
Platelets: 234 10*3/uL (ref 150–400)
RBC: 3.34 MIL/uL — ABNORMAL LOW (ref 3.87–5.11)
RDW: 14.2 % (ref 11.5–15.5)
WBC: 10.8 10*3/uL — ABNORMAL HIGH (ref 4.0–10.5)
nRBC: 0 % (ref 0.0–0.2)

## 2018-11-27 MED ORDER — CONCEPT OB 130-92.4-1 MG PO CAPS: 1.0000 | ORAL_CAPSULE | Freq: Every day | ORAL | 3 refills | Status: AC

## 2018-11-27 NOTE — Discharge Instructions (Signed)
Anemia  Anemia is a condition in which you do not have enough red blood cells or hemoglobin. Hemoglobin is a substance in red blood cells that carries oxygen. When you do not have enough red blood cells or hemoglobin (are anemic), your body cannot get enough oxygen and your organs may not work properly. As a result, you may feel very tired or have other problems. What are the causes? Common causes of anemia include:  Excessive bleeding. Anemia can be caused by excessive bleeding inside or outside the body, including bleeding from the intestine or from periods in women.  Poor nutrition.  Long-lasting (chronic) kidney, thyroid, and liver disease.  Bone marrow disorders.  Cancer and treatments for cancer.  HIV (human immunodeficiency virus) and AIDS (acquired immunodeficiency syndrome).  Treatments for HIV and AIDS.  Spleen problems.  Blood disorders.  Infections, medicines, and autoimmune disorders that destroy red blood cells. What are the signs or symptoms? Symptoms of this condition include:  Minor weakness.  Dizziness.  Headache.  Feeling heartbeats that are irregular or faster than normal (palpitations).  Shortness of breath, especially with exercise.  Paleness.  Cold sensitivity.  Indigestion.  Nausea.  Difficulty sleeping.  Difficulty concentrating. Symptoms may occur suddenly or develop slowly. If your anemia is mild, you may not have symptoms. How is this diagnosed? This condition is diagnosed based on:  Blood tests.  Your medical history.  A physical exam.  Bone marrow biopsy. Your health care provider may also check your stool (feces) for blood and may do additional testing to look for the cause of your bleeding. You may also have other tests, including:  Imaging tests, such as a CT scan or MRI.  Endoscopy.  Colonoscopy. How is this treated? Treatment for this condition depends on the cause. If you continue to lose a lot of blood, you may  need to be treated at a hospital. Treatment may include:  Taking supplements of iron, vitamin M08, or folic acid.  Taking a hormone medicine (erythropoietin) that can help to stimulate red blood cell growth.  Having a blood transfusion. This may be needed if you lose a lot of blood.  Making changes to your diet.  Having surgery to remove your spleen. Follow these instructions at home:  Take over-the-counter and prescription medicines only as told by your health care provider.  Take supplements only as told by your health care provider.  Follow any diet instructions that you were given.  Keep all follow-up visits as told by your health care provider. This is important. Contact a health care provider if:  You develop new bleeding anywhere in the body. Get help right away if:  You are very weak.  You are short of breath.  You have pain in your abdomen or chest.  You are dizzy or feel faint.  You have trouble concentrating.  You have bloody or black, tarry stools.  You vomit repeatedly or you vomit up blood. Summary  Anemia is a condition in which you do not have enough red blood cells or enough of a substance in your red blood cells that carries oxygen (hemoglobin).  Symptoms may occur suddenly or develop slowly.  If your anemia is mild, you may not have symptoms.  This condition is diagnosed with blood tests as well as a medical history and physical exam. Other tests may be needed.  Treatment for this condition depends on the cause of the anemia. This information is not intended to replace advice given to you by  your health care provider. Make sure you discuss any questions you have with your health care provider. Document Released: 11/28/2004 Document Revised: 11/22/2016 Document Reviewed: 11/22/2016 Elsevier Interactive Patient Education  2019 [employer] Inc[employer]  Syncope Syncope is when you pass out (faint) for a short time. It is caused by a sudden decrease in blood  flow to the brain. Signs that you may be about to pass out include:  Feeling dizzy or light-headed.  Feeling sick to your stomach (nauseous).  Seeing all white or all black.  Having cold, clammy skin. If you pass out, get help right away. Call your local emergency services (911 in the [place] Do not drive yourself to the hospital. Follow these instructions at home: Watch for any changes in your symptoms. Take these actions to stay safe and help with your symptoms: Lifestyle  Do not drive, use machinery, or play sports until your doctor says it is okay.  Do not drink alcohol.  Do not use any products that contain nicotine or tobacco, such as cigarettes and e-cigarettes. If you need help quitting, ask your doctor.  Drink enough fluid to keep your pee (urine) pale yellow. General instructions  Take over-the-counter and prescription medicines only as told by your doctor.  If you are taking blood pressure or heart medicine, sit up and stand up slowly. Spend a few minutes getting ready to sit and then stand. This can help you feel less dizzy.  Have someone stay with you until you feel stable.  If you start to feel like you might pass out, lie down right away and raise (elevate) your feet above the level of your heart. Breathe deeply and steadily. Wait until all of the symptoms are gone.  Keep all follow-up visits as told by your doctor. This is important. Get help right away if:  You have a very bad headache.  You pass out once or more than once.  You have pain in your chest, belly, or back.  You have a very fast or uneven heartbeat (palpitations).  It hurts to breathe.  You are bleeding from your mouth or your bottom (rectum).  You have black or tarry poop (stool).  You have jerky movements that you cannot control (seizure).  You are confused.  You have trouble walking.  You are very weak.  You have vision problems. These symptoms may be an emergency. Do not wait to  see if the symptoms will go away. Get medical help right away. Call your local emergency services (911 in the [place] Do not drive yourself to the hospital. Summary  Syncope is when you pass out (faint) for a short time. It is caused by a sudden decrease in blood flow to the brain.  Signs that you may be about to faint include feeling dizzy, light-headed, or sick to your stomach, seeing all white or all black, or having cold, clammy skin.  If you start to feel like you might pass out, lie down right away and raise (elevate) your feet above the level of your heart. Breathe deeply and steadily. Wait until all of the symptoms are gone. This information is not intended to replace advice given to you by your health care provider. Make sure you discuss any questions you have with your health care provider. Document Released: 04/08/2008 Document Revised: 12/03/2017 Document Reviewed: 12/03/2017 Elsevier Interactive Patient Education  2019 [employer].

## 2018-11-27 NOTE — MAU Note (Signed)
Pt presents to MAU due to a syncopal episode today around 345 pm. She had headache prior which has gotten better but still feels weak. Pt states that she has ate well and drank fluids today. Denies VB and LOF.
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
## 2018-11-29 ENCOUNTER — Other Ambulatory Visit: Payer: Self-pay

## 2018-12-14 ENCOUNTER — Encounter (HOSPITAL_COMMUNITY): Payer: Self-pay | Admitting: *Deleted

## 2018-12-14 ENCOUNTER — Inpatient Hospital Stay (HOSPITAL_COMMUNITY)
Admission: AD | Admit: 2018-12-14 | Discharge: 2018-12-14 | Disposition: A | Discharge status: Home / Self Care | Payer: Medicaid Other | Attending: Obstetrics and Gynecology | Admitting: Obstetrics and Gynecology

## 2018-12-14 DIAGNOSIS — Z3A18 18 weeks gestation of pregnancy: Secondary | ICD-10-CM | POA: Diagnosis not present

## 2018-12-14 DIAGNOSIS — O210 Mild hyperemesis gravidarum: Secondary | ICD-10-CM | POA: Diagnosis present

## 2018-12-14 LAB — URINALYSIS, MICROSCOPIC (REFLEX): RBC / HPF: NONE SEEN RBC/hpf (ref 0–5)

## 2018-12-14 LAB — URINALYSIS, ROUTINE W REFLEX MICROSCOPIC
Bilirubin Urine: NEGATIVE
Glucose, UA: NEGATIVE mg/dL
Hgb urine dipstick: NEGATIVE
Ketones, ur: NEGATIVE mg/dL
Nitrite: NEGATIVE
PROTEIN: NEGATIVE mg/dL
Specific Gravity, Urine: 1.01 (ref 1.005–1.030)
pH: 7.5 (ref 5.0–8.0)

## 2018-12-14 MED ORDER — PROMETHAZINE HCL 25 MG/ML IJ SOLN
25.0000 mg | Freq: Once | INTRAVENOUS | Status: DC
  Filled 2018-12-14: qty 1

## 2018-12-14 MED ORDER — M.V.I. ADULT IV INJ
Freq: Once | INTRAVENOUS | Status: AC
  Administered 2018-12-14: 17:00:00 via INTRAVENOUS
  Filled 2018-12-14: qty 1000

## 2018-12-14 MED ORDER — PROMETHAZINE HCL 25 MG RE SUPP: 25.0000 mg | Freq: Four times a day (QID) | RECTAL | 1 refills | Status: DC | PRN

## 2018-12-14 NOTE — MAU Note (Signed)
Pt reports nausea and vomiting throughout preg, worsening over the last 24 hours, meds not helping.

## 2018-12-14 NOTE — MAU Provider Note (Signed)
History     CSN: [id number]  Arrival date and time: 12/14/18 1419   First Provider Initiated Contact with Patient 12/14/18 1449      Chief Complaint  Patient presents with  . Nausea  . Emesis   HPI  Ms.  Kristen Reese is a 23 y.o. year old 631P0000 female at 5575w6d weeks gestation who presents to MAU reporting worsening N/V over last 24 hrs. She takes Dicelgis for N/V. Her last dose was last night. She reports vomiting ~ 2 hrs after talking that dose. She does not take Diclegis the maximum amount of times/day and also reports "only taking it when I need it." She went to work today. She had to leave because she was "getting sick way too much." She denies calling her OB office. She is a patient of [facility]; last appt on 11/20/2018. Her next appt is 12/17/2018.  Past Medical History:  Diagnosis Date  . Migraine   . Seizures (HCC)     Past Surgical History:  Procedure Laterality Date  . WISDOM TOOTH EXTRACTION      History reviewed. No pertinent family history.  Social History   Tobacco Use  . Smoking status: Never Smoker  . Smokeless tobacco: Never Used  Substance Use Topics  . Alcohol use: No  . Drug use: No    Allergies: No Known Allergies  Medications Prior to Admission  Medication Sig Dispense Refill Last Dose  . Doxylamine-Pyridoxine 10-10 MG TBEC 2 PO qhs; may take 1po in am and 1po in afternoon prn nausea 120 tablet 3 12/13/2018 at Unknown time  . Prenat w/o A Vit-FeFum-FePo-FA (CONCEPT OB) 130-92.4-1 MG CAPS Take 1 tablet by mouth daily. 90 capsule 3 12/14/2018 at Unknown time  . promethazine (PHENERGAN) 12.5 MG tablet Take 2 tablets (25 mg total) by mouth every 6 (six) hours as needed for nausea or vomiting. 30 tablet 0 More than a month at Unknown time  . promethazine (PHENERGAN) 25 MG suppository Place 1 suppository (25 mg total) rectally every 6 (six) hours as needed for nausea or vomiting. 12 each 1 More than a month at Unknown time  . valACYclovir (VALTREX) 500  MG tablet Take 500 mg by mouth 2 (two) times daily.   Unknown at Unknown time    Review of Systems  Constitutional: Negative.   HENT: Negative.   Eyes: Negative.   Respiratory: Negative.   Cardiovascular: Negative.   Gastrointestinal: Positive for nausea and vomiting.  Endocrine: Negative.   Genitourinary: Negative.   Musculoskeletal: Negative.   Skin: Negative.   Allergic/Immunologic: Negative.   Neurological: Negative.   Hematological: Negative.   Psychiatric/Behavioral: Negative.    Physical Exam   Blood pressure 109/63, pulse 92, temperature 98 F (36.7 C), temperature source Oral, resp. rate 16, height 5' (1.524 m), weight 60.3 kg, last menstrual period 08/04/2018, SpO2 98 %.  Physical Exam  Nursing note and vitals reviewed. Constitutional: She is oriented to person, place, and time. She appears well-developed and well-nourished.  HENT:  Head: Normocephalic and atraumatic.  Eyes: Pupils are equal, round, and reactive to light.  Neck: Normal range of motion.  Cardiovascular: Normal rate.  Respiratory: Effort normal.  GI: Soft.  Genitourinary:    Genitourinary Comments: Pelvic deferred   Neurological: She is alert and oriented to person, place, and time.  Skin: Skin is warm and dry.  Psychiatric: She has a normal mood and affect. Her behavior is normal. Judgment and thought content normal.    MAU Course  Procedures  MDM [facility] IVFs: Phenergan 25 mg in LR 1000 ml @ 999 ml/hr; followed by MVI in LR 1000 ml @ 500 ml/hr -- no nausea/vomiting PO Challenge -- patient tolerated well   Results for orders placed or performed during the hospital encounter of 12/14/18 (from the past 24 hour(s))  Urinalysis, Routine w reflex microscopic     Status: Abnormal   Collection Time: 12/14/18  2:34 PM  Result Value Ref Range   Color, Urine YELLOW YELLOW   APPearance CLEAR CLEAR   Specific Gravity, Urine 1.010 1.005 - 1.030   pH 7.5 5.0 - 8.0   Glucose, UA NEGATIVE NEGATIVE mg/dL    Hgb urine dipstick NEGATIVE NEGATIVE   Bilirubin Urine NEGATIVE NEGATIVE   Ketones, ur NEGATIVE NEGATIVE mg/dL   Protein, ur NEGATIVE NEGATIVE mg/dL   Nitrite NEGATIVE NEGATIVE   Leukocytes, UA SMALL (A) NEGATIVE  Urinalysis, Microscopic (reflex)     Status: Abnormal   Collection Time: 12/14/18  2:34 PM  Result Value Ref Range   RBC / HPF NONE SEEN 0 - 5 RBC/hpf   WBC, UA 0-5 0 - 5 WBC/hpf   Bacteria, UA FEW (A) NONE SEEN   Squamous Epithelial / LPF 0-5 0 - 5    Assessment and Plan  Morning sickness - Plan: Discharge patient - Rx refill for Phenergan 25 mg suppositories to be used vaginally - Information provided on morning sickness and hyperemesis gravidarium - Keep scheduled appt on 12/17/2018 - Patient verbalized an understanding of the plan of care and agrees.    Raelyn Moraolitta Pietro Bonura, MSN, CNM 12/14/2018, 2:49 PM

## 2018-12-18 ENCOUNTER — Ambulatory Visit (HOSPITAL_COMMUNITY): Admission: RE | Admit: 2018-12-18 | Payer: 59 | Source: Ambulatory Visit

## 2018-12-21 ENCOUNTER — Other Ambulatory Visit (HOSPITAL_COMMUNITY): Payer: Self-pay | Admitting: Maternal & Fetal Medicine

## 2018-12-23 ENCOUNTER — Other Ambulatory Visit (HOSPITAL_COMMUNITY): Payer: Self-pay | Admitting: Obstetrics and Gynecology

## 2018-12-23 ENCOUNTER — Ambulatory Visit (HOSPITAL_COMMUNITY)
Admission: RE | Admit: 2018-12-23 | Discharge: 2018-12-23 | Disposition: A | Discharge status: Home / Self Care | Payer: Medicaid Other | Source: Ambulatory Visit | Attending: Obstetrics & Gynecology | Admitting: Obstetrics & Gynecology

## 2018-12-23 ENCOUNTER — Other Ambulatory Visit: Payer: Self-pay

## 2018-12-23 ENCOUNTER — Encounter (HOSPITAL_COMMUNITY): Payer: Self-pay

## 2018-12-23 ENCOUNTER — Ambulatory Visit (HOSPITAL_COMMUNITY): Payer: Self-pay | Admitting: Obstetrics and Gynecology

## 2018-12-23 ENCOUNTER — Other Ambulatory Visit (HOSPITAL_COMMUNITY): Payer: Self-pay | Admitting: Maternal & Fetal Medicine

## 2018-12-23 ENCOUNTER — Ambulatory Visit (HOSPITAL_COMMUNITY): Payer: Self-pay | Admitting: Obstetrics & Gynecology

## 2018-12-23 DIAGNOSIS — O359XX0 Maternal care for (suspected) fetal abnormality and damage, unspecified, not applicable or unspecified: Secondary | ICD-10-CM

## 2018-12-23 DIAGNOSIS — O99352 Diseases of the nervous system complicating pregnancy, second trimester: Secondary | ICD-10-CM | POA: Insufficient documentation

## 2018-12-23 DIAGNOSIS — Z363 Encounter for antenatal screening for malformations: Secondary | ICD-10-CM | POA: Insufficient documentation

## 2018-12-23 DIAGNOSIS — G40909 Epilepsy, unspecified, not intractable, without status epilepticus: Secondary | ICD-10-CM | POA: Diagnosis not present

## 2018-12-23 DIAGNOSIS — O358XX0 Maternal care for other (suspected) fetal abnormality and damage, not applicable or unspecified: Secondary | ICD-10-CM | POA: Insufficient documentation

## 2018-12-23 DIAGNOSIS — Q985 Karyotype 47, XYY: Secondary | ICD-10-CM

## 2018-12-23 DIAGNOSIS — Z3A20 20 weeks gestation of pregnancy: Secondary | ICD-10-CM | POA: Insufficient documentation

## 2018-12-23 DIAGNOSIS — O289 Unspecified abnormal findings on antenatal screening of mother: Secondary | ICD-10-CM | POA: Insufficient documentation

## 2018-12-23 DIAGNOSIS — O210 Mild hyperemesis gravidarum: Secondary | ICD-10-CM

## 2018-12-23 HISTORY — DX: Anogenital herpesviral infection, unspecified: A60.9

## 2018-12-24 ENCOUNTER — Other Ambulatory Visit (HOSPITAL_COMMUNITY): Payer: Self-pay | Admitting: *Deleted

## 2018-12-24 ENCOUNTER — Telehealth (HOSPITAL_COMMUNITY): Payer: Self-pay | Admitting: *Deleted

## 2018-12-24 DIAGNOSIS — O359XX0 Maternal care for (suspected) fetal abnormality and damage, unspecified, not applicable or unspecified: Secondary | ICD-10-CM

## 2018-12-24 NOTE — Telephone Encounter (Signed)
Call to patient post amniocentesis.  Pt doing well, with no complaints.

## 2018-12-25 ENCOUNTER — Telehealth (HOSPITAL_COMMUNITY): Payer: Self-pay | Admitting: Maternal & Fetal Medicine

## 2018-12-25 ENCOUNTER — Other Ambulatory Visit (HOSPITAL_COMMUNITY): Payer: Self-pay | Admitting: Obstetrics & Gynecology

## 2018-12-25 ENCOUNTER — Other Ambulatory Visit (HOSPITAL_COMMUNITY): Payer: Self-pay | Admitting: Obstetrics and Gynecology

## 2018-12-25 LAB — HEMOGLOBINOPATHY EVALUATION
HGB F QUANT: 0 % (ref 0.0–2.0)
Hgb A2 Quant: 2.1 % (ref 1.8–3.2)
Hgb A: 97.9 % (ref 96.4–98.8)
Hgb C: 0 %
Hgb S Quant: 0 %
Hgb Variant: 0 %

## 2018-12-25 NOTE — Telephone Encounter (Signed)
I spoke with Kristen Reese regarding the FISH result of XYY. This result confirms the NIPS result. We will contact her with additional results as the report in.  Lin Landsman, MD

## 2019-01-05 LAB — INHERITEST CORE(CF97,SMA,FRAX)

## 2019-01-08 ENCOUNTER — Encounter (HOSPITAL_COMMUNITY): Payer: Self-pay | Admitting: Maternal & Fetal Medicine

## 2019-01-08 LAB — INSIGHT AMNIO FISH XY,13,18,21
Cells Analyzed: 50
Cells Counted: 50

## 2019-01-08 LAB — CHROM. 5 COUNT +  REVEAL SNP MICROARRAY
Cells Analyzed: 5
Cells Counted: 5
Cells Karyotyped: 2
Colonies: 5
GTG Band Resolution Achieved: 450

## 2019-01-08 LAB — MATERNAL CELL CONTAMINATION

## 2019-01-08 LAB — AFP, AMNIOTIC FLUID
AFP, Amniotic Fluid (mcg/ml): 6.4 ug/mL
Gestational Age(Wks): 20
MOM, Amniotic Fluid: 1.03

## 2019-01-20 ENCOUNTER — Ambulatory Visit (HOSPITAL_COMMUNITY)
Admission: RE | Admit: 2019-01-20 | Discharge: 2019-01-20 | Disposition: A | Discharge status: Home / Self Care | Payer: Medicaid Other | Source: Ambulatory Visit | Attending: Obstetrics and Gynecology | Admitting: Obstetrics and Gynecology

## 2019-01-20 ENCOUNTER — Encounter (HOSPITAL_COMMUNITY): Payer: Self-pay | Admitting: *Deleted

## 2019-01-20 ENCOUNTER — Other Ambulatory Visit: Payer: Self-pay

## 2019-01-20 ENCOUNTER — Other Ambulatory Visit (HOSPITAL_COMMUNITY): Payer: Self-pay | Admitting: *Deleted

## 2019-01-20 ENCOUNTER — Ambulatory Visit (HOSPITAL_COMMUNITY): Payer: Medicaid Other | Admitting: *Deleted

## 2019-01-20 DIAGNOSIS — Z362 Encounter for other antenatal screening follow-up: Secondary | ICD-10-CM

## 2019-01-20 DIAGNOSIS — Z3A24 24 weeks gestation of pregnancy: Secondary | ICD-10-CM

## 2019-01-20 DIAGNOSIS — O359XX0 Maternal care for (suspected) fetal abnormality and damage, unspecified, not applicable or unspecified: Secondary | ICD-10-CM | POA: Insufficient documentation

## 2019-01-20 DIAGNOSIS — O99353 Diseases of the nervous system complicating pregnancy, third trimester: Secondary | ICD-10-CM | POA: Diagnosis not present

## 2019-01-20 DIAGNOSIS — O210 Mild hyperemesis gravidarum: Secondary | ICD-10-CM | POA: Insufficient documentation

## 2019-01-20 DIAGNOSIS — O289 Unspecified abnormal findings on antenatal screening of mother: Secondary | ICD-10-CM

## 2019-01-20 DIAGNOSIS — O281 Abnormal biochemical finding on antenatal screening of mother: Secondary | ICD-10-CM

## 2019-01-20 DIAGNOSIS — G40909 Epilepsy, unspecified, not intractable, without status epilepticus: Secondary | ICD-10-CM

## 2019-02-17 ENCOUNTER — Ambulatory Visit (HOSPITAL_COMMUNITY): Payer: Medicaid Other | Admitting: *Deleted

## 2019-02-17 ENCOUNTER — Encounter (HOSPITAL_COMMUNITY): Payer: Self-pay

## 2019-02-17 ENCOUNTER — Ambulatory Visit (HOSPITAL_COMMUNITY)
Admission: RE | Admit: 2019-02-17 | Discharge: 2019-02-17 | Disposition: A | Discharge status: Home / Self Care | Payer: Medicaid Other | Source: Ambulatory Visit | Attending: Obstetrics and Gynecology | Admitting: Obstetrics and Gynecology

## 2019-02-17 ENCOUNTER — Other Ambulatory Visit: Payer: Self-pay

## 2019-02-17 VITALS — BP 113/60 | HR 82 | Temp 98.9°F | Wt 147.2 lb

## 2019-02-17 DIAGNOSIS — O210 Mild hyperemesis gravidarum: Secondary | ICD-10-CM | POA: Insufficient documentation

## 2019-02-17 DIAGNOSIS — O359XX0 Maternal care for (suspected) fetal abnormality and damage, unspecified, not applicable or unspecified: Secondary | ICD-10-CM

## 2019-02-17 DIAGNOSIS — Z362 Encounter for other antenatal screening follow-up: Secondary | ICD-10-CM | POA: Diagnosis not present

## 2019-02-17 DIAGNOSIS — G40909 Epilepsy, unspecified, not intractable, without status epilepticus: Secondary | ICD-10-CM

## 2019-02-17 DIAGNOSIS — O0990 Supervision of high risk pregnancy, unspecified, unspecified trimester: Secondary | ICD-10-CM

## 2019-02-17 DIAGNOSIS — O281 Abnormal biochemical finding on antenatal screening of mother: Secondary | ICD-10-CM | POA: Insufficient documentation

## 2019-02-17 DIAGNOSIS — O99353 Diseases of the nervous system complicating pregnancy, third trimester: Secondary | ICD-10-CM

## 2019-02-17 DIAGNOSIS — Z3A28 28 weeks gestation of pregnancy: Secondary | ICD-10-CM

## 2019-02-17 DIAGNOSIS — Z364 Encounter for antenatal screening for fetal growth retardation: Secondary | ICD-10-CM | POA: Diagnosis not present

## 2019-03-17 ENCOUNTER — Ambulatory Visit (HOSPITAL_COMMUNITY): Payer: 59

## 2019-03-17 ENCOUNTER — Ambulatory Visit (HOSPITAL_COMMUNITY): Admission: RE | Admit: 2019-03-17 | Payer: 59 | Source: Ambulatory Visit

## 2019-03-22 ENCOUNTER — Inpatient Hospital Stay (HOSPITAL_COMMUNITY)
Admission: AD | Admit: 2019-03-22 | Discharge: 2019-03-22 | Disposition: A | Discharge status: Home / Self Care | Payer: 59 | Attending: Obstetrics and Gynecology | Admitting: Obstetrics and Gynecology

## 2019-03-22 ENCOUNTER — Other Ambulatory Visit: Payer: Self-pay

## 2019-03-22 ENCOUNTER — Encounter (HOSPITAL_COMMUNITY): Payer: Self-pay

## 2019-03-22 DIAGNOSIS — Z3A32 32 weeks gestation of pregnancy: Secondary | ICD-10-CM | POA: Diagnosis not present

## 2019-03-22 DIAGNOSIS — O210 Mild hyperemesis gravidarum: Secondary | ICD-10-CM

## 2019-03-22 DIAGNOSIS — O219 Vomiting of pregnancy, unspecified: Secondary | ICD-10-CM | POA: Diagnosis not present

## 2019-03-22 DIAGNOSIS — O212 Late vomiting of pregnancy: Secondary | ICD-10-CM | POA: Diagnosis present

## 2019-03-22 LAB — COMPREHENSIVE METABOLIC PANEL
ALT: 27 U/L (ref 0–44)
AST: 26 U/L (ref 15–41)
Albumin: 3.3 g/dL — ABNORMAL LOW (ref 3.5–5.0)
Alkaline Phosphatase: 95 U/L (ref 38–126)
Anion gap: 12 (ref 5–15)
BUN: <5 mg/dL — ABNORMAL LOW (ref 6–20)
CO2: 22 mmol/L (ref 22–32)
Calcium: 9.3 mg/dL (ref 8.9–10.3)
Chloride: 104 mmol/L (ref 98–111)
Creatinine, Ser: 0.41 mg/dL — ABNORMAL LOW (ref 0.44–1.00)
GFR calc Af Amer: >60 mL/min (ref >60)
GFR calc non Af Amer: >60 mL/min (ref >60)
Glucose, Bld: 72 mg/dL (ref 70–99)
Potassium: 3.7 mmol/L (ref 3.5–5.1)
Sodium: 138 mmol/L (ref 135–145)
Total Bilirubin: 1.1 mg/dL (ref 0.3–1.2)
Total Protein: 6.3 g/dL — ABNORMAL LOW (ref 6.5–8.1)

## 2019-03-22 LAB — URINALYSIS, ROUTINE W REFLEX MICROSCOPIC
Bilirubin Urine: NEGATIVE
Glucose, UA: NEGATIVE mg/dL
Hgb urine dipstick: NEGATIVE
Ketones, ur: 80 mg/dL — AB
Leukocytes,Ua: NEGATIVE
Nitrite: NEGATIVE
Protein, ur: NEGATIVE mg/dL
Specific Gravity, Urine: 1.02 (ref 1.005–1.030)
pH: 8 (ref 5.0–8.0)

## 2019-03-22 MED ORDER — SCOPOLAMINE 1 MG/3DAYS TD PT72
1.0000 | MEDICATED_PATCH | TRANSDERMAL | Status: DC
  Administered 2019-03-22: 1.5 mg via TRANSDERMAL
  Filled 2019-03-22: qty 1

## 2019-03-22 MED ORDER — SCOPOLAMINE 1 MG/3DAYS TD PT72: 1.0000 | MEDICATED_PATCH | TRANSDERMAL | 1 refills | Status: AC

## 2019-03-22 MED ORDER — FAMOTIDINE IN NACL 20-0.9 MG/50ML-% IV SOLN
20.0000 mg | Freq: Once | INTRAVENOUS | Status: AC
  Administered 2019-03-22: 20 mg via INTRAVENOUS
  Filled 2019-03-22: qty 50

## 2019-03-22 MED ORDER — LACTATED RINGERS IV BOLUS
1000.0000 mL | Freq: Once | INTRAVENOUS | Status: AC
  Administered 2019-03-22: 1000 mL via INTRAVENOUS

## 2019-03-22 MED ORDER — SODIUM CHLORIDE 0.9 % IV SOLN
8.0000 mg | Freq: Once | INTRAVENOUS | Status: AC
  Administered 2019-03-22: 8 mg via INTRAVENOUS
  Filled 2019-03-22: qty 4

## 2019-03-22 MED ORDER — ONDANSETRON 8 MG PO TBDP: 8.0000 mg | ORAL_TABLET | Freq: Three times a day (TID) | ORAL | 0 refills | Status: DC | PRN

## 2019-03-22 NOTE — MAU Provider Note (Signed)
History     CSN: [id number]  Arrival date and time: 03/22/19 1353   First Provider Initiated Contact with Patient 03/22/19 1459      Chief Complaint  Patient presents with  . Emesis   HPI   Ms.Kristen Reese is a 23 y.o. female [redacted]w[redacted]d here in MAU with nausea and vomiting. The symptoms have been present since the start of her pregnancy. What concerned her today was that she saw some streaks of blood in her vomit. It was a very small amount. She says she has not been able to keep anything down.  She is taking diclegis and phenergan for the symptoms which helps minimally. She has no abdominal pain or vaginal bleeding. + fetal movements.    OB History    Gravida  1   Para  0   Term  0   Preterm  0   AB  0   Living  0     SAB  0   TAB  0   Ectopic  0   Multiple  0   Live Births              Past Medical History:  Diagnosis Date  . HSV (herpes simplex virus) anogenital infection   . Migraine   . Seizures (HCC)     Past Surgical History:  Procedure Laterality Date  . WISDOM TOOTH EXTRACTION      Family History  Problem Relation Age of Onset  . Hypertension Mother   . Diabetes Maternal Grandmother   . Heart disease Maternal Grandmother   . Hypertension Maternal Grandmother   . Hypertension Maternal Grandfather     Social History   Tobacco Use  . Smoking status: Never Smoker  . Smokeless tobacco: Never Used  Substance Use Topics  . Alcohol use: No  . Drug use: No    Allergies: No Known Allergies  Medications Prior to Admission  Medication Sig Dispense Refill Last Dose  . Doxylamine-Pyridoxine 10-10 MG TBEC 2 PO qhs; may take 1po in am and 1po in afternoon prn nausea 120 tablet 3 Taking  . Prenat w/o A Vit-FeFum-FePo-FA (CONCEPT OB) 130-92.4-1 MG CAPS Take 1 tablet by mouth daily. 90 capsule 3 Taking  . promethazine (PHENERGAN) 12.5 MG tablet Take 2 tablets (25 mg total) by mouth every 6 (six) hours as needed for nausea or vomiting.  (Patient not taking: Reported on 12/23/2018) 30 tablet 0 Not Taking  . promethazine (PHENERGAN) 25 MG suppository Place 1 suppository (25 mg total) rectally every 6 (six) hours as needed for nausea or vomiting. Insert VAGINALLY, NOT RECTALLY (Patient not taking: Reported on 12/23/2018) 12 each 1 Not Taking  . valACYclovir (VALTREX) 500 MG tablet Take 500 mg by mouth 2 (two) times daily.   Not Taking   Results for orders placed or performed during the hospital encounter of 03/22/19 (from the past 48 hour(s))  Urinalysis, Routine w reflex microscopic     Status: Abnormal   Collection Time: 03/22/19  2:19 PM  Result Value Ref Range   Color, Urine YELLOW YELLOW   APPearance HAZY (A) CLEAR   Specific Gravity, Urine 1.020 1.005 - 1.030   pH 8.0 5.0 - 8.0   Glucose, UA NEGATIVE NEGATIVE mg/dL   Hgb urine dipstick NEGATIVE NEGATIVE   Bilirubin Urine NEGATIVE NEGATIVE   Ketones, ur 80 (A) NEGATIVE mg/dL   Protein, ur NEGATIVE NEGATIVE mg/dL   Nitrite NEGATIVE NEGATIVE   Leukocytes,Ua NEGATIVE NEGATIVE    Comment: Performed  at [facility], [address] [place], [place] [address]  Comprehensive metabolic panel     Status: Abnormal   Collection Time: 03/22/19  2:48 PM  Result Value Ref Range   Sodium 138 135 - 145 mmol/L   Potassium 3.7 3.5 - 5.1 mmol/L   Chloride 104 98 - 111 mmol/L   CO2 22 22 - 32 mmol/L   Glucose, Bld 72 70 - 99 mg/dL   BUN <5 (L) 6 - 20 mg/dL   Creatinine, Ser 6.040.41 (L) 0.44 - 1.00 mg/dL   Calcium 9.3 8.9 - 54.010.3 mg/dL   Total Protein 6.3 (L) 6.5 - 8.1 g/dL   Albumin 3.3 (L) 3.5 - 5.0 g/dL   AST 26 15 - 41 U/L   ALT 27 0 - 44 U/L   Alkaline Phosphatase 95 38 - 126 U/L   Total Bilirubin 1.1 0.3 - 1.2 mg/dL   GFR calc non Af Amer >60 >60 mL/min   GFR calc Af Amer >60 >60 mL/min   Anion gap 12 5 - 15    Comment: Performed at [facility], [address] [place], [place] [address]    Review of Systems  Constitutional: Negative for fever.   Gastrointestinal: Positive for nausea and vomiting. Negative for abdominal pain and diarrhea.  Genitourinary: Negative for vaginal bleeding and vaginal discharge.   Physical Exam   Blood pressure 133/75, pulse 73, temperature 98.1 F (36.7 C), temperature source Oral, resp. rate 16, height 5' (1.524 m), weight 66.1 kg, last menstrual period 08/04/2018, SpO2 100 %, unknown if currently breastfeeding.  Physical Exam  Constitutional: She is oriented to person, place, and time. She appears well-developed and well-nourished. No distress.  HENT:  Head: Normocephalic.  Eyes: Pupils are equal, round, and reactive to light.  Respiratory: Effort normal.  GI: Soft. She exhibits no distension and no mass. There is no abdominal tenderness. There is no rebound and no guarding.  Musculoskeletal: Normal range of motion.  Neurological: She is alert and oriented to person, place, and time.  Skin: Skin is warm. She is not diaphoretic.  Psychiatric: Her behavior is normal.   Fetal Tracing: Baseline: 130 bpm Variability: Moderate  Accelerations: 15x15 Decelerations: None Toco: UI  MAU Course  Procedures  None  MDM  Urine shows > 80 ketones LR bolus x1 Pepcid 20 mg given & Zofran 8 MG IV Scopolamine patch applied.  Patient feeling much better and tolerating oral fluids.   Assessment and Plan   A:  1. Nausea and vomiting in pregnancy   2. Morning sickness   3. [redacted] weeks gestation of pregnancy     P:  Discharge home in stable condition Rx: Zofran, Scopolamine Small, bland meals Return to MAU if symptoms worsen Increase oral fluid intake   Shanon Becvar, Harolyn RutherfordJennifer I, NP 03/22/2019 7:32 PM

## 2019-03-22 NOTE — MAU Note (Signed)
Pt says she's has had ongoing vomiting during the pregnancy but today saw some blood streaked in the vomit. No pain, LOF or bleeding.

## 2019-03-22 NOTE — Discharge Instructions (Signed)

## 2019-04-13 LAB — OB RESULTS CONSOLE GBS: GBS: NEGATIVE

## 2019-04-30 ENCOUNTER — Other Ambulatory Visit: Payer: Self-pay | Admitting: Family Medicine

## 2019-04-30 ENCOUNTER — Encounter (HOSPITAL_COMMUNITY): Payer: Self-pay

## 2019-04-30 ENCOUNTER — Other Ambulatory Visit: Payer: Self-pay | Admitting: Obstetrics & Gynecology

## 2019-04-30 ENCOUNTER — Telehealth (HOSPITAL_COMMUNITY): Payer: Self-pay

## 2019-04-30 NOTE — Telephone Encounter (Signed)
Preadmission screen  

## 2019-05-05 ENCOUNTER — Other Ambulatory Visit (HOSPITAL_COMMUNITY)
Admission: RE | Admit: 2019-05-05 | Discharge: 2019-05-05 | Disposition: A | Discharge status: Home / Self Care | Payer: 59 | Source: Ambulatory Visit | Attending: Obstetrics and Gynecology | Admitting: Obstetrics and Gynecology

## 2019-05-05 ENCOUNTER — Other Ambulatory Visit: Payer: Self-pay

## 2019-05-05 DIAGNOSIS — Z01812 Encounter for preprocedural laboratory examination: Secondary | ICD-10-CM | POA: Insufficient documentation

## 2019-05-05 DIAGNOSIS — Z1159 Encounter for screening for other viral diseases: Secondary | ICD-10-CM | POA: Diagnosis not present

## 2019-05-05 LAB — SARS CORONAVIRUS 2 (TAT 6-24 HRS): SARS Coronavirus 2: NEGATIVE

## 2019-05-05 NOTE — MAU Note (Signed)
Asymptomatic, swab collected. 

## 2019-05-06 ENCOUNTER — Other Ambulatory Visit (HOSPITAL_COMMUNITY): Payer: Self-pay | Admitting: *Deleted

## 2019-05-06 MED ORDER — TERBUTALINE SULFATE 1 MG/ML IJ SOLN
INTRAMUSCULAR | Status: AC
  Filled 2019-05-06: qty 1

## 2019-05-06 NOTE — H&P (Addendum)
Kristen Reese is a 23 y.o. 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 Echo WNL on 01/08/2019, right pyelectasis and bilat ventricular EIF noted on growth scan 5/13, EFW @ that time was 4.11lbs, female fetus, GBS-. Covid -. 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 Pt endorse + Fm. Denies vaginal leakage. Denies vaginal bleeding. Denies feeling cxt's.   Patient Active Problem List   Diagnosis Date Noted  . Morning sickness 12/14/2018   No medications prior to admission.    Past Medical History:  Diagnosis Date  . HSV (herpes simplex virus) anogenital infection   . Migraine   . Seizures ([place])      No current facility-administered medications on file prior to encounter.    Current Outpatient Medications on File Prior to Encounter  Medication Sig Dispense Refill  . Doxylamine-Pyridoxine 10-10 MG TBEC 2 PO qhs; may take 1po in am and 1po in afternoon prn nausea 120 tablet 3  . ondansetron (ZOFRAN ODT) 8 MG disintegrating tablet Take 1 tablet (8 mg total) by mouth every 8 (eight) hours as needed for nausea or vomiting. 20 tablet 0  . Prenat w/o A Vit-FeFum-FePo-FA (CONCEPT OB) 130-92.4-1 MG CAPS Take 1 tablet by mouth daily. 90 capsule 3  . promethazine (PHENERGAN) 12.5 MG tablet Take 2 tablets (25 mg total) by mouth every 6 (six) hours as needed for nausea or vomiting. (Patient not taking: Reported on 12/23/2018) 30 tablet 0  . promethazine (PHENERGAN) 25 MG suppository Place 1 suppository (25 mg total) rectally every 6 (six) hours as needed for nausea or vomiting. Insert VAGINALLY, NOT RECTALLY (Patient not taking: Reported on 12/23/2018) 12 each 1  . valACYclovir (VALTREX) 500 MG tablet Take 500 mg by mouth 2 (two) times daily.       No Known Allergies  History of present pregnancy: Pt Info/Preference:  Screening/Consents:  Labs:   EDD: Estimated Date of Delivery: 05/11/19  Establised: Patient's last menstrual period was 08/04/2018.  Anatomy  Scan: Date: 12/17/2018 Placenta Location: anterior Genetic Screen: Panoroma:YY chromosomes AFP: wnl First Tri: Quad: NT 4.6  Office: [facility]             First PNV: 15.3 wg Blood Type O/Positive/-- (01/10 0000)  Language: english Last PNV: 38.2 wg Rhogam    Flu Vaccine:  declined   Antibody Negative (01/10 0000)  TDaP vaccine utd   GTT: Early: 4.7 Third Trimester: 90  Feeding Plan: Breast/bottle BTL: no Rubella: Immune (01/10 0000)  Contraception: ??? VBAC: no RPR: Nonreactive (01/10 0000)   Circumcision: Out pt desired   HBsAg: Negative (01/10 0000)  Pediatrician:  ???   HIV: Non-reactive (01/10 0000)   Prenatal Classes: no Additional [place]: BPP 8/8 v6/25 Growth 5/13 see belwo  GBS:  (For PCN allergy, check sensitivities)       Chlamydia: neg    MFM Referral/Consult:  GC: neg  Support Person: partner   PAP: never  Pain Management: epidural Neonatologist Referral:  Hgb Electrophoresis:  AA  Birth Plan: no   Hgb NOB: 11.6    28W: 10.7  5/13 growth  OB History    Gravida  1   Para  0   Term  0   Preterm  0   AB  0   Living  0     SAB  0   TAB  0   Ectopic  0   Multiple  0   Live Births  Past Medical History:  Diagnosis Date  . HSV (herpes simplex virus) anogenital infection   . Migraine   . Seizures (HCC)    Past Surgical History:  Procedure Laterality Date  . WISDOM TOOTH EXTRACTION     Family History: family history includes Diabetes in her maternal grandmother; Heart disease in her maternal grandmother; Hypertension in her maternal grandfather, maternal grandmother, and mother. Social History:  reports that she has never smoked. She has never used smokeless tobacco. She reports that she does not drink alcohol or use drugs.   Prenatal Transfer Tool  Maternal Diabetes: No Genetic Screening: Abnormal:  Results: Other:XYY Maternal Ultrasounds/Referrals: Isolated EIF (echogenic intracardiac focus), right mild pyelectasis Fetal Ultrasounds or other  Referrals:  Fetal echo, Referred to Materal Fetal Medicine  Maternal Substance Abuse:  No Significant Maternal Medications:  Meds include: Other: valtrex @ 36weeks Significant Maternal Lab Results: Group B Strep negative and Other: HSV+  ROS:  Review of Systems  Constitutional: Negative.   HENT: Negative.   Eyes: Negative.   Respiratory: Negative.   Cardiovascular: Negative.   Gastrointestinal: Negative.   Genitourinary: Negative.   Musculoskeletal: Negative.   Skin: Negative.   Neurological: Negative.   Endo/Heme/Allergies: Negative.   Psychiatric/Behavioral: Negative.      Physical Exam: LMP 08/04/2018   Physical Exam  Constitutional: She is oriented to person, place, and time and well-developed, well-nourished, and in no distress.  HENT:  Head: Normocephalic and atraumatic.  Eyes: Pupils are equal, round, and reactive to light. Conjunctivae are normal.  Neck: Normal range of motion. Neck supple.  Cardiovascular: Normal rate and regular rhythm.  Pulmonary/Chest: Effort normal and breath sounds normal.  Abdominal: Soft. Bowel sounds are normal.  Genitourinary:    Cervix normal.     Genitourinary Comments: uterus soft, non-tender, gravida equal to dates, pelvis adequate for vaginal delivery, no lesion noted.    Neurological: She is alert and oriented to person, place, and time. Gait normal.  Skin: Skin is warm and dry.  Psychiatric: Affect normal.  Nursing note and vitals reviewed.    NST: FHR baseline 125 bpm, Variability: moderate, Accelerations:present, Decelerations:  Absent= Cat 1/Reactive UC:   irregular, every 3-6 minutes SVE: 1/50/ball    , vertex verified by fetal sutures.    Labs: No results found for this or any previous visit (from the past 24 hour(s)).  Imaging:  No results found.  MAU Course: No orders of the defined types were placed in this encounter.  Meds ordered this encounter  Medications  . DISCONTD: terbutaline (BRETHINE) 1 MG/ML injection     Ament, Kristen Reese   : cabinet override    Assessment/Plan: ActuaryAmber Daisha Katrinka Reese is a 23 y.o. 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 Echo WNL on 01/08/2019, right pyelectasis and bilat ventricular EIF noted on growth scan 5/13, EFW @ that time was 4.11lbs, female fetus, GBS-. Covid -. 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 Pt endorse + Fm. Denies vaginal leakage. Denies vaginal bleeding. Denies feeling cxt's.   FWB: Cat 1 Fetal Tracing.   Plan: Admit to Birthing Suite per consult with Dr Mora ApplPinn Routine [facility] orders Pain med/epidural prn Start induction with cytotec for cervical ripening.  Anticipate labor progression   [facility] Gehlhausen NP-C, CNM, MSN 05/07/2019, [phone]

## 2019-05-07 ENCOUNTER — Inpatient Hospital Stay (HOSPITAL_COMMUNITY): Payer: 59 | Admitting: Anesthesiology

## 2019-05-07 ENCOUNTER — Encounter (HOSPITAL_COMMUNITY)
Admission: AD | Disposition: A | Discharge status: Home / Self Care | Payer: Self-pay | Source: Home / Self Care | Attending: Obstetrics & Gynecology

## 2019-05-07 ENCOUNTER — Inpatient Hospital Stay (HOSPITAL_COMMUNITY)
Admission: AD | Admit: 2019-05-07 | Discharge: 2019-05-10 | DRG: 786 | Disposition: A | Discharge status: Home / Self Care | Payer: 59 | Attending: Obstetrics & Gynecology | Admitting: Obstetrics & Gynecology

## 2019-05-07 ENCOUNTER — Other Ambulatory Visit: Payer: Self-pay

## 2019-05-07 ENCOUNTER — Inpatient Hospital Stay (HOSPITAL_COMMUNITY): Payer: 59

## 2019-05-07 ENCOUNTER — Encounter (HOSPITAL_COMMUNITY): Payer: Self-pay

## 2019-05-07 DIAGNOSIS — O210 Mild hyperemesis gravidarum: Secondary | ICD-10-CM

## 2019-05-07 DIAGNOSIS — O9832 Other infections with a predominantly sexual mode of transmission complicating childbirth: Secondary | ICD-10-CM | POA: Diagnosis present

## 2019-05-07 DIAGNOSIS — Z1159 Encounter for screening for other viral diseases: Secondary | ICD-10-CM | POA: Diagnosis not present

## 2019-05-07 DIAGNOSIS — Z3A39 39 weeks gestation of pregnancy: Secondary | ICD-10-CM

## 2019-05-07 DIAGNOSIS — O4593 Premature separation of placenta, unspecified, third trimester: Secondary | ICD-10-CM | POA: Diagnosis present

## 2019-05-07 DIAGNOSIS — Z880 Allergy status to penicillin: Secondary | ICD-10-CM

## 2019-05-07 DIAGNOSIS — A6000 Herpesviral infection of urogenital system, unspecified: Secondary | ICD-10-CM | POA: Diagnosis present

## 2019-05-07 DIAGNOSIS — O351XX0 Maternal care for (suspected) chromosomal abnormality in fetus, not applicable or unspecified: Principal | ICD-10-CM | POA: Diagnosis present

## 2019-05-07 DIAGNOSIS — Q985 Karyotype 47, XYY: Secondary | ICD-10-CM

## 2019-05-07 LAB — TYPE AND SCREEN
ABO/RH(D): O POS
Antibody Screen: NEGATIVE

## 2019-05-07 LAB — CBC
HCT: 36.1 % (ref 36.0–46.0)
Hemoglobin: 12.1 g/dL (ref 12.0–15.0)
MCH: 31 pg (ref 26.0–34.0)
MCHC: 33.5 g/dL (ref 30.0–36.0)
MCV: 92.6 fL (ref 80.0–100.0)
Platelets: 208 10*3/uL (ref 150–400)
RBC: 3.9 MIL/uL (ref 3.87–5.11)
RDW: 15.3 % (ref 11.5–15.5)
WBC: 14.7 10*3/uL — ABNORMAL HIGH (ref 4.0–10.5)
nRBC: 0 % (ref 0.0–0.2)

## 2019-05-07 LAB — ABO/RH: ABO/RH(D): O POS

## 2019-05-07 LAB — RPR: RPR Ser Ql: NONREACTIVE

## 2019-05-07 SURGERY — Surgical Case
Anesthesia: Epidural | Site: Abdomen | Wound class: Clean Contaminated

## 2019-05-07 MED ORDER — ONDANSETRON HCL 4 MG/2ML IJ SOLN
INTRAMUSCULAR | Status: DC | PRN
  Administered 2019-05-07: 4 mg via INTRAVENOUS

## 2019-05-07 MED ORDER — ZOLPIDEM TARTRATE 5 MG PO TABS: 5.0000 mg | ORAL_TABLET | Freq: Every evening | ORAL | Status: DC | PRN

## 2019-05-07 MED ORDER — DIBUCAINE (PERIANAL) 1 % EX OINT: 1.0000 "application " | TOPICAL_OINTMENT | CUTANEOUS | Status: DC | PRN

## 2019-05-07 MED ORDER — BUPIVACAINE HCL (PF) 0.25 % IJ SOLN
INTRAMUSCULAR | Status: DC | PRN
  Administered 2019-05-07: 30 mL

## 2019-05-07 MED ORDER — CEFAZOLIN SODIUM-DEXTROSE 2-3 GM-%(50ML) IV SOLR
INTRAVENOUS | Status: DC | PRN
  Administered 2019-05-07: 2 g via INTRAVENOUS

## 2019-05-07 MED ORDER — LACTATED RINGERS IV SOLN
INTRAVENOUS | Status: DC
  Administered 2019-05-07 (×4): via INTRAVENOUS

## 2019-05-07 MED ORDER — FLEET ENEMA 7-19 GM/118ML RE ENEM: 1.0000 | ENEMA | RECTAL | Status: DC | PRN

## 2019-05-07 MED ORDER — SOD CITRATE-CITRIC ACID 500-334 MG/5ML PO SOLN
30.0000 mL | ORAL | Status: DC | PRN
  Administered 2019-05-07: 30 mL via ORAL
  Filled 2019-05-07: qty 30

## 2019-05-07 MED ORDER — OXYTOCIN 40 UNITS IN NORMAL SALINE INFUSION - SIMPLE MED
INTRAVENOUS | Status: AC
  Filled 2019-05-07: qty 1000

## 2019-05-07 MED ORDER — ONDANSETRON HCL 4 MG/2ML IJ SOLN
INTRAMUSCULAR | Status: AC
  Filled 2019-05-07: qty 2

## 2019-05-07 MED ORDER — NALBUPHINE HCL 10 MG/ML IJ SOLN: 5.0000 mg | INTRAMUSCULAR | Status: DC | PRN

## 2019-05-07 MED ORDER — OXYTOCIN 40 UNITS IN NORMAL SALINE INFUSION - SIMPLE MED
1.0000 m[IU]/min | INTRAVENOUS | Status: DC
  Administered 2019-05-07: 1 m[IU]/min via INTRAVENOUS
  Filled 2019-05-07: qty 1000

## 2019-05-07 MED ORDER — OXYTOCIN 40 UNITS IN NORMAL SALINE INFUSION - SIMPLE MED
2.5000 [IU]/h | INTRAVENOUS | Status: DC
  Administered 2019-05-07: 2.5 [IU]/h via INTRAVENOUS

## 2019-05-07 MED ORDER — DIPHENHYDRAMINE HCL 25 MG PO CAPS: 25.0000 mg | ORAL_CAPSULE | Freq: Four times a day (QID) | ORAL | Status: DC | PRN

## 2019-05-07 MED ORDER — SODIUM CHLORIDE 0.9 % IV SOLN
INTRAVENOUS | Status: DC | PRN
  Administered 2019-05-07: 40 [IU] via INTRAVENOUS

## 2019-05-07 MED ORDER — SODIUM CHLORIDE 0.9 % IV SOLN
INTRAVENOUS | Status: DC | PRN
  Administered 2019-05-07: 17:00:00 via INTRAVENOUS

## 2019-05-07 MED ORDER — PHENYLEPHRINE HCL (PRESSORS) 10 MG/ML IV SOLN
INTRAVENOUS | Status: DC | PRN
  Administered 2019-05-07: 40 ug via INTRAVENOUS

## 2019-05-07 MED ORDER — PRENATAL MULTIVITAMIN CH
1.0000 | ORAL_TABLET | Freq: Every day | ORAL | Status: DC
  Administered 2019-05-08 – 2019-05-09 (×2): 1 via ORAL
  Filled 2019-05-07 (×3): qty 1

## 2019-05-07 MED ORDER — SIMETHICONE 80 MG PO CHEW
80.0000 mg | CHEWABLE_TABLET | ORAL | Status: DC
  Administered 2019-05-08 – 2019-05-09 (×2): 80 mg via ORAL
  Filled 2019-05-07 (×2): qty 1

## 2019-05-07 MED ORDER — LIDOCAINE-EPINEPHRINE (PF) 2 %-1:200000 IJ SOLN
INTRAMUSCULAR | Status: DC | PRN
  Administered 2019-05-07: 3 mL via EPIDURAL
  Administered 2019-05-07: 11 mL via EPIDURAL
  Administered 2019-05-07: 3 mL via EPIDURAL

## 2019-05-07 MED ORDER — FENTANYL CITRATE (PF) 100 MCG/2ML IJ SOLN
50.0000 ug | INTRAMUSCULAR | Status: DC | PRN
  Administered 2019-05-07: 50 ug via INTRAVENOUS
  Filled 2019-05-07: qty 2

## 2019-05-07 MED ORDER — STERILE WATER FOR IRRIGATION IR SOLN
Status: DC | PRN
  Administered 2019-05-07: 1

## 2019-05-07 MED ORDER — FENTANYL-BUPIVACAINE-NACL 0.5-0.125-0.9 MG/250ML-% EP SOLN: 12.0000 mL/h | EPIDURAL | Status: DC | PRN

## 2019-05-07 MED ORDER — KETOROLAC TROMETHAMINE 30 MG/ML IJ SOLN: 30.0000 mg | Freq: Once | INTRAMUSCULAR | Status: DC

## 2019-05-07 MED ORDER — OXYCODONE HCL 5 MG PO TABS
5.0000 mg | ORAL_TABLET | ORAL | Status: DC | PRN
  Administered 2019-05-09 (×2): 5 mg via ORAL
  Filled 2019-05-07 (×2): qty 1

## 2019-05-07 MED ORDER — EPHEDRINE 5 MG/ML INJ: 10.0000 mg | INTRAVENOUS | Status: DC | PRN

## 2019-05-07 MED ORDER — SODIUM CHLORIDE (PF) 0.9 % IJ SOLN
INTRAMUSCULAR | Status: DC | PRN
  Administered 2019-05-07: 12 mL/h via EPIDURAL

## 2019-05-07 MED ORDER — DIPHENHYDRAMINE HCL 25 MG PO CAPS: 25.0000 mg | ORAL_CAPSULE | ORAL | Status: DC | PRN

## 2019-05-07 MED ORDER — TETANUS-DIPHTH-ACELL PERTUSSIS 5-2.5-18.5 LF-MCG/0.5 IM SUSP: 0.5000 mL | Freq: Once | INTRAMUSCULAR | Status: DC

## 2019-05-07 MED ORDER — FENTANYL-BUPIVACAINE-NACL 0.5-0.125-0.9 MG/250ML-% EP SOLN
EPIDURAL | Status: AC
  Filled 2019-05-07: qty 250

## 2019-05-07 MED ORDER — PHENYLEPHRINE 40 MCG/ML (10ML) SYRINGE FOR IV PUSH (FOR BLOOD PRESSURE SUPPORT): 80.0000 ug | PREFILLED_SYRINGE | INTRAVENOUS | Status: DC | PRN

## 2019-05-07 MED ORDER — NALBUPHINE HCL 10 MG/ML IJ SOLN: 5.0000 mg | Freq: Once | INTRAMUSCULAR | Status: DC | PRN

## 2019-05-07 MED ORDER — SENNOSIDES-DOCUSATE SODIUM 8.6-50 MG PO TABS
2.0000 | ORAL_TABLET | ORAL | Status: DC
  Administered 2019-05-08 – 2019-05-09 (×2): 2 via ORAL
  Filled 2019-05-07 (×2): qty 2

## 2019-05-07 MED ORDER — DEXAMETHASONE SODIUM PHOSPHATE 10 MG/ML IJ SOLN
INTRAMUSCULAR | Status: DC | PRN
  Administered 2019-05-07: 10 mg via INTRAVENOUS

## 2019-05-07 MED ORDER — MISOPROSTOL 25 MCG QUARTER TABLET
ORAL_TABLET | ORAL | Status: AC
  Administered 2019-05-07: 25 ug via VAGINAL
  Filled 2019-05-07: qty 1

## 2019-05-07 MED ORDER — LACTATED RINGERS IV SOLN
INTRAVENOUS | Status: DC
  Administered 2019-05-07 – 2019-05-08 (×2): via INTRAVENOUS

## 2019-05-07 MED ORDER — DIPHENHYDRAMINE HCL 50 MG/ML IJ SOLN: 12.5000 mg | INTRAMUSCULAR | Status: DC | PRN

## 2019-05-07 MED ORDER — ONDANSETRON HCL 4 MG/2ML IJ SOLN
4.0000 mg | Freq: Three times a day (TID) | INTRAMUSCULAR | Status: DC | PRN
  Administered 2019-05-07: 4 mg via INTRAVENOUS
  Filled 2019-05-07: qty 2

## 2019-05-07 MED ORDER — KETOROLAC TROMETHAMINE 30 MG/ML IJ SOLN
30.0000 mg | Freq: Four times a day (QID) | INTRAMUSCULAR | Status: AC
  Administered 2019-05-08: 30 mg via INTRAVENOUS
  Filled 2019-05-07 (×2): qty 1

## 2019-05-07 MED ORDER — IBUPROFEN 800 MG PO TABS
800.0000 mg | ORAL_TABLET | Freq: Four times a day (QID) | ORAL | Status: DC
  Administered 2019-05-08 – 2019-05-10 (×7): 800 mg via ORAL
  Filled 2019-05-07 (×8): qty 1

## 2019-05-07 MED ORDER — OXYTOCIN 40 UNITS IN NORMAL SALINE INFUSION - SIMPLE MED: 2.5000 [IU]/h | INTRAVENOUS | Status: AC

## 2019-05-07 MED ORDER — ACETAMINOPHEN 10 MG/ML IV SOLN
INTRAVENOUS | Status: AC
  Filled 2019-05-07: qty 100

## 2019-05-07 MED ORDER — SODIUM CHLORIDE 0.9 % IR SOLN
Status: DC | PRN
  Administered 2019-05-07: 1

## 2019-05-07 MED ORDER — ACETAMINOPHEN 325 MG PO TABS: 650.0000 mg | ORAL_TABLET | ORAL | Status: DC | PRN

## 2019-05-07 MED ORDER — LIDOCAINE HCL (PF) 1 % IJ SOLN: 30.0000 mL | INTRAMUSCULAR | Status: DC | PRN

## 2019-05-07 MED ORDER — MISOPROSTOL 25 MCG QUARTER TABLET
25.0000 ug | ORAL_TABLET | ORAL | Status: DC | PRN
  Administered 2019-05-07 (×2): 25 ug via VAGINAL
  Filled 2019-05-07: qty 1

## 2019-05-07 MED ORDER — MORPHINE SULFATE (PF) 0.5 MG/ML IJ SOLN
INTRAMUSCULAR | Status: DC | PRN
  Administered 2019-05-07: 3 mg via EPIDURAL

## 2019-05-07 MED ORDER — NALOXONE HCL 4 MG/10ML IJ SOLN
1.0000 ug/kg/h | INTRAVENOUS | Status: DC | PRN
  Filled 2019-05-07: qty 5

## 2019-05-07 MED ORDER — LACTATED RINGERS IV SOLN: 500.0000 mL | INTRAVENOUS | Status: DC | PRN

## 2019-05-07 MED ORDER — ACETAMINOPHEN 500 MG PO TABS
1000.0000 mg | ORAL_TABLET | Freq: Four times a day (QID) | ORAL | Status: DC
  Administered 2019-05-08 – 2019-05-10 (×8): 1000 mg via ORAL
  Filled 2019-05-07 (×10): qty 2

## 2019-05-07 MED ORDER — SCOPOLAMINE 1 MG/3DAYS TD PT72
1.0000 | MEDICATED_PATCH | Freq: Once | TRANSDERMAL | Status: DC
  Administered 2019-05-07: 1.5 mg via TRANSDERMAL
  Filled 2019-05-07: qty 1

## 2019-05-07 MED ORDER — SIMETHICONE 80 MG PO CHEW
80.0000 mg | CHEWABLE_TABLET | Freq: Three times a day (TID) | ORAL | Status: DC
  Administered 2019-05-08 – 2019-05-09 (×3): 80 mg via ORAL
  Filled 2019-05-07 (×5): qty 1

## 2019-05-07 MED ORDER — TERBUTALINE SULFATE 1 MG/ML IJ SOLN: 0.2500 mg | Freq: Once | INTRAMUSCULAR | Status: DC | PRN

## 2019-05-07 MED ORDER — ACETAMINOPHEN 10 MG/ML IV SOLN
1000.0000 mg | Freq: Once | INTRAVENOUS | Status: DC | PRN
  Administered 2019-05-07: 1000 mg via INTRAVENOUS

## 2019-05-07 MED ORDER — OXYTOCIN BOLUS FROM INFUSION: 500.0000 mL | Freq: Once | INTRAVENOUS | Status: DC

## 2019-05-07 MED ORDER — COCONUT OIL OIL: 1.0000 "application " | TOPICAL_OIL | Status: DC | PRN

## 2019-05-07 MED ORDER — FENTANYL CITRATE (PF) 100 MCG/2ML IJ SOLN: 50.0000 ug | INTRAMUSCULAR | Status: DC | PRN

## 2019-05-07 MED ORDER — PHENYLEPHRINE 40 MCG/ML (10ML) SYRINGE FOR IV PUSH (FOR BLOOD PRESSURE SUPPORT)
PREFILLED_SYRINGE | INTRAVENOUS | Status: AC
  Filled 2019-05-07: qty 10

## 2019-05-07 MED ORDER — MORPHINE SULFATE (PF) 0.5 MG/ML IJ SOLN
INTRAMUSCULAR | Status: AC
  Filled 2019-05-07: qty 10

## 2019-05-07 MED ORDER — WITCH HAZEL-GLYCERIN EX PADS: 1.0000 "application " | MEDICATED_PAD | CUTANEOUS | Status: DC | PRN

## 2019-05-07 MED ORDER — CEFAZOLIN SODIUM-DEXTROSE 2-4 GM/100ML-% IV SOLN
INTRAVENOUS | Status: AC
  Filled 2019-05-07: qty 100

## 2019-05-07 MED ORDER — SODIUM CHLORIDE 0.9% FLUSH: 3.0000 mL | INTRAVENOUS | Status: DC | PRN

## 2019-05-07 MED ORDER — NALOXONE HCL 0.4 MG/ML IJ SOLN: 0.4000 mg | INTRAMUSCULAR | Status: DC | PRN

## 2019-05-07 MED ORDER — PROMETHAZINE HCL 25 MG/ML IJ SOLN
25.0000 mg | Freq: Three times a day (TID) | INTRAMUSCULAR | Status: DC | PRN
  Administered 2019-05-07: 25 mg via INTRAVENOUS
  Filled 2019-05-07: qty 1

## 2019-05-07 MED ORDER — SIMETHICONE 80 MG PO CHEW
80.0000 mg | CHEWABLE_TABLET | ORAL | Status: DC | PRN
  Administered 2019-05-08 – 2019-05-09 (×2): 80 mg via ORAL

## 2019-05-07 MED ORDER — LACTATED RINGERS IV SOLN
500.0000 mL | Freq: Once | INTRAVENOUS | Status: AC
  Administered 2019-05-07: 14:00:00 via INTRAVENOUS

## 2019-05-07 MED ORDER — BUPIVACAINE HCL (PF) 0.25 % IJ SOLN
INTRAMUSCULAR | Status: AC
  Filled 2019-05-07: qty 30

## 2019-05-07 MED ORDER — FENTANYL CITRATE (PF) 100 MCG/2ML IJ SOLN: 25.0000 ug | INTRAMUSCULAR | Status: DC | PRN

## 2019-05-07 MED ORDER — ONDANSETRON HCL 4 MG/2ML IJ SOLN
4.0000 mg | Freq: Four times a day (QID) | INTRAMUSCULAR | Status: DC | PRN
  Administered 2019-05-07 (×2): 4 mg via INTRAVENOUS
  Filled 2019-05-07 (×2): qty 2

## 2019-05-07 MED ORDER — MENTHOL 3 MG MT LOZG: 1.0000 | LOZENGE | OROMUCOSAL | Status: DC | PRN

## 2019-05-07 SURGICAL SUPPLY — 39 items
APL SKNCLS STERI-STRIP NONHPOA (GAUZE/BANDAGES/DRESSINGS) ×1
BENZOIN TINCTURE PRP APPL 2/3 (GAUZE/BANDAGES/DRESSINGS) ×3 IMPLANT
CHLORAPREP W/TINT 26ML (MISCELLANEOUS) ×3 IMPLANT
CLAMP CORD UMBIL (MISCELLANEOUS) IMPLANT
CLOSURE WOUND 1/2 X4 (GAUZE/BANDAGES/DRESSINGS) ×1
CLOTH BEACON ORANGE TIMEOUT ST (SAFETY) ×3 IMPLANT
DRSG OPSITE POSTOP 4X10 (GAUZE/BANDAGES/DRESSINGS) ×3 IMPLANT
ELECT REM PT RETURN 9FT ADLT (ELECTROSURGICAL) ×3
ELECTRODE REM PT RTRN 9FT ADLT (ELECTROSURGICAL) ×1 IMPLANT
EXTRACTOR VACUUM KIWI (MISCELLANEOUS) IMPLANT
GLOVE BIO SURGEON STRL SZ 6.5 (GLOVE) ×2 IMPLANT
GLOVE BIO SURGEONS STRL SZ 6.5 (GLOVE) ×1
GLOVE BIOGEL PI IND STRL 7.0 (GLOVE) ×2 IMPLANT
GLOVE BIOGEL PI INDICATOR 7.0 (GLOVE) ×4
GOWN STRL REUS W/TWL LRG LVL3 (GOWN DISPOSABLE) ×9 IMPLANT
KIT ABG SYR 3ML LUER SLIP (SYRINGE) IMPLANT
NDL HYPO 25X5/8 SAFETYGLIDE (NEEDLE) IMPLANT
NEEDLE HYPO 22GX1.5 SAFETY (NEEDLE) ×2 IMPLANT
NEEDLE HYPO 25X5/8 SAFETYGLIDE (NEEDLE) IMPLANT
NS IRRIG 1000ML POUR BTL (IV SOLUTION) ×3 IMPLANT
PACK C SECTION WH (CUSTOM PROCEDURE TRAY) ×3 IMPLANT
PAD OB MATERNITY 4.3X12.25 (PERSONAL CARE ITEMS) ×3 IMPLANT
PENCIL SMOKE EVAC W/HOLSTER (ELECTROSURGICAL) ×3 IMPLANT
RTRCTR C-SECT PINK 25CM LRG (MISCELLANEOUS) ×3 IMPLANT
STRIP CLOSURE SKIN 1/2X4 (GAUZE/BANDAGES/DRESSINGS) ×2 IMPLANT
SUT CHROMIC 2 0 CT 1 (SUTURE) ×6 IMPLANT
SUT MNCRL 0 VIOLET CTX 36 (SUTURE) ×2 IMPLANT
SUT MONOCRYL 0 CTX 36 (SUTURE) ×4
SUT PDS AB 0 CTX 36 PDP370T (SUTURE) IMPLANT
SUT PLAIN 2 0 (SUTURE)
SUT PLAIN 2 0 XLH (SUTURE) ×2 IMPLANT
SUT PLAIN ABS 2-0 CT1 27XMFL (SUTURE) IMPLANT
SUT VIC AB 0 CTX 36 (SUTURE) ×6
SUT VIC AB 0 CTX36XBRD ANBCTRL (SUTURE) ×2 IMPLANT
SUT VIC AB 4-0 KS 27 (SUTURE) ×3 IMPLANT
SYR CONTROL 10ML LL (SYRINGE) ×2 IMPLANT
TOWEL OR 17X24 6PK STRL BLUE (TOWEL DISPOSABLE) ×3 IMPLANT
TRAY FOLEY W/BAG SLVR 14FR LF (SET/KITS/TRAYS/PACK) ×3 IMPLANT
WATER STERILE IRR 1000ML POUR (IV SOLUTION) ×3 IMPLANT

## 2019-05-07 NOTE — Anesthesia Preprocedure Evaluation (Signed)
Anesthesia Evaluation  Patient identified by MRN, date of birth, ID band Patient awake    Reviewed: Allergy & Precautions, NPO status , Patient's Chart, lab work & pertinent test results  Airway Mallampati: II  TM Distance: >3 FB Neck ROM: Full    Dental no notable dental hx.    Pulmonary neg pulmonary ROS,    Pulmonary exam normal breath sounds clear to auscultation       Cardiovascular negative cardio ROS Normal cardiovascular exam Rhythm:Regular Rate:Normal     Neuro/Psych  Headaches, Seizures -,  negative psych ROS   GI/Hepatic negative GI ROS, Neg liver ROS,   Endo/Other  negative endocrine ROS  Renal/GU negative Renal ROS  negative genitourinary   Musculoskeletal negative musculoskeletal ROS (+)   Abdominal   Peds  Hematology negative hematology ROS (+)   Anesthesia Other Findings IOL for fetal XYY chromosomes  Reproductive/Obstetrics (+) Pregnancy                             Anesthesia Physical Anesthesia Plan  ASA: II  Anesthesia Plan: Epidural   Post-op Pain Management:    Induction:   PONV Risk Score and Plan: Treatment may vary due to age or medical condition  Airway Management Planned: Natural Airway  Additional Equipment:   Intra-op Plan:   Post-operative Plan:   Informed Consent: I have reviewed the patients History and Physical, chart, labs and discussed the procedure including the risks, benefits and alternatives for the proposed anesthesia with the patient or authorized representative who has indicated his/her understanding and acceptance.       Plan Discussed with: Anesthesiologist  Anesthesia Plan Comments: (Patient identified. Risks, benefits, options discussed with patient including but not limited to bleeding, infection, nerve damage, paralysis, failed block, incomplete pain control, headache, blood pressure changes, nausea, vomiting, reactions to  medication, itching, and post partum back pain. Confirmed with bedside nurse the patient's most recent platelet count. Confirmed with the patient that they are not taking any anticoagulation, have any bleeding history or any family history of bleeding disorders. Patient expressed understanding and wishes to proceed. All questions were answered. )        Anesthesia Quick Evaluation

## 2019-05-07 NOTE — Anesthesia Postprocedure Evaluation (Signed)
Anesthesia Post Note  Patient: Production assistant, radio  Procedure(s) Performed: CESAREAN SECTION (N/A Abdomen)     Patient location during evaluation: PACU Anesthesia Type: Epidural Level of consciousness: awake Pain management: pain level controlled Vital Signs Assessment: post-procedure vital signs reviewed and stable Respiratory status: spontaneous breathing Cardiovascular status: stable Postop Assessment: epidural receding Anesthetic complications: no    Last Vitals:  Vitals:   05/07/19 1832 05/07/19 1836  BP:  124/76  Pulse: 95   Resp: (!) 21   Temp:    SpO2: 100%     Last Pain:  Vitals:   05/07/19 1806  TempSrc: Oral  PainSc: 0-No pain   Pain Goal:    LLE Motor Response: Purposeful movement (05/07/19 1836)   RLE Motor Response: Purposeful movement (05/07/19 1836)       Epidural/Spinal Function Cutaneous sensation: Normal sensation (05/07/19 1836), Patient able to flex knees: Yes (05/07/19 1836), Patient able to lift hips off bed: Yes (05/07/19 1836), Back pain beyond tenderness at insertion site: No (05/07/19 1836), Progressively worsening motor and/or sensory loss: No (05/07/19 1836), Bowel and/or bladder incontinence post epidural: No (05/07/19 1836)  Randy Whitener L Wyman Meschke

## 2019-05-07 NOTE — Anesthesia Procedure Notes (Signed)
Epidural Patient location during procedure: OB Start time: 05/07/2019 12:45 PM End time: 05/07/2019 1:00 PM  Staffing Anesthesiologist: Freddrick March, MD Performed: anesthesiologist   Preanesthetic Checklist Completed: patient identified, pre-op evaluation, timeout performed, IV checked, risks and benefits discussed and monitors and equipment checked  Epidural Patient position: sitting Prep: site prepped and draped and DuraPrep Patient monitoring: continuous pulse ox, blood pressure, heart rate and cardiac monitor Approach: midline Location: L3-L4 Injection technique: LOR air  Needle:  Needle type: Tuohy  Needle gauge: 17 G Needle length: 9 cm Needle insertion depth: 6 cm Catheter type: closed end flexible Catheter size: 19 Gauge Catheter at skin depth: 12 cm Test dose: negative  Assessment Sensory level: T8 Events: blood not aspirated, injection not painful, no injection resistance, negative IV test and no paresthesia  Additional Notes Patient identified. Risks/Benefits/Options discussed with patient including but not limited to bleeding, infection, nerve damage, paralysis, failed block, incomplete pain control, headache, blood pressure changes, nausea, vomiting, reactions to medication both or allergic, itching and postpartum back pain. Confirmed with bedside nurse the patient's most recent platelet count. Confirmed with patient that they are not currently taking any anticoagulation, have any bleeding history or any family history of bleeding disorders. Patient expressed understanding and wished to proceed. All questions were answered. Sterile technique was used throughout the entire procedure. Please see nursing notes for vital signs. Test dose was given through epidural catheter and negative prior to continuing to dose epidural or start infusion. Warning signs of high block given to the patient including shortness of breath, tingling/numbness in hands, complete motor block, or  any concerning symptoms with instructions to call for help. Patient was given instructions on fall risk and not to get out of bed. All questions and concerns addressed with instructions to call with any issues or inadequate analgesia.  Reason for block:procedure for pain

## 2019-05-07 NOTE — Transfer of Care (Signed)
Immediate Anesthesia Transfer of Care Note  Patient: Kristen Reese  Procedure(s) Performed: CESAREAN SECTION (N/A Abdomen)  Patient Location: PACU  Anesthesia Type:Epidural  Level of Consciousness: awake, alert  and oriented  Airway & Oxygen Therapy: Patient Spontanous Breathing  Post-op Assessment: Report given to RN and Post -op Vital signs reviewed and stable  Post vital signs: Reviewed and stable  Last Vitals:  Vitals Value Taken Time  BP 125/84 05/07/19 1806  Temp 36.7 C 05/07/19 1806  Pulse 80 05/07/19 1811  Resp 22 05/07/19 1811  SpO2 100 % 05/07/19 1811  Vitals shown include unvalidated device data.  Last Pain:  Vitals:   05/07/19 1806  TempSrc: Oral  PainSc: 0-No pain         Complications: No apparent anesthesia complications

## 2019-05-07 NOTE — Op Note (Signed)
Cesarean Section Procedure Note  Jossalin Elder CyphersDaisha Cruickshank  DOB:    03/08/1996  MRN:    [id number]  Date of Surgery:  05/07/2019  Indication: 39 week 3 day SIUP admitted for Induction of labor for XYY syndrome with fetal intolerance of labor. There were repetitive late decelerations with regular contractions. Patient is now being taken back to operating room for Cat II / III tracing  Pre-operative Diagnosis: Non-reassuring fetal heart tracing  Post-operative Diagnosis: same plus suspected placental abruption  Procedure:  Primary cesarean delivery                         Surgeon: Wynonia HazardPINN, Ashten Sarnowski [name], MD  Assistants: Genice RougeUCKER, TRACEY, RFNA  Anesthesia: Epidural  anesthesia  ASA Class: 2  Procedure Details   The patient was counseled about the risks, benefits, complications of the cesarean section. The patient concurred with the proposed plan, giving informed consent.  The site of surgery properly noted/marked. The patient was taken to Operating Room # C, identified as [employer] [facility] and the procedure verified as C-Section Delivery. A Time Out was held and the above information confirmed.  After epidural was found to adequate , the patient was placed in the dorsal supine position with a leftward tilt, draped and prepped in the usual sterile manner. A Pfannenstiel incision was made with a 10 blade scalpel and the incision carried down through the subcutaneous tissue to the fascia.  The fascia was incised in the midline and the fascial incision was extended laterally with Mayo scissors. The superior aspect of the fascial incision was grasped with Coker clamps x2, tented up and the rectus muscles dissected off sharply with the bovie.  The rectus was then dissected off with blunt dissection and the bovie inferiorly. The rectus muscles were separated in the midline. The abdominal peritoneum was identified, and bluntly entered using surgeons fingers. The peritoneal opening was bluntly extended with gentle  pulling.   The Alexis retractor was then deployed. The vesicouterine peritoneum was identified, tented up, entered sharply with Metzenbaum scissors, and the bladder flap was created digitally. Scalpel was then used to make a low transverse incision on the uterus which was extended laterally with  blunt dissection. The fetal vertex was identified and delivered from cephalic presentation.  A live healthy Female with Apgar scores of 9 at one minute and 10 at five minutes. The placenta was noted to be already separated from the uterine wall so the umbilical cord was quickly clamped and cut, the baby was handed off to waiting Pediatricians. Cord blood was obtained for evaluation. The placenta was spontaneously removed intact. The placenta was handed off to be sent as a specimen for Pathology, both for r/o abruption as well as baby with XYY.   The uterus was cleared of all clot and debris. The uterine incision was repaired with #0 Monocryl in running locked fashion. A second imbricating suture was performed using the same suture. The incision was hemostatic. Ovaries and tubes were inspected and normal. The Alexis retractor was removed. The abdominal cavity was cleared of all clot and debris. The abdominal peritoneum was reapproximated with 2-0 chromic  in a running fashion, the rectus muscles was reapproximated with #2 chromic in interrupted fashion. The fascia was closed with 0 Vicryl in a running fashion. The subcuticular layer was irrigated and all bleeders cauterized.  30 mL of 0.5% Marcaine was injected into the subcutaneous layer.  The Scarpas fascia was re-approximated with interrupted sutures of  2-0 plain.   The skin was closed with 4-0 vicryl in a subcuticular fashion using a Lanny Hurst needle. The incision was dressed with benzoine, steri strips and pressure dressing. All sponge lap and needle counts were correct x3.   Patient tolerated the procedure well and recovered in stable condition following the  procedure.  Instrument, sponge, and needle counts were correct prior the abdominal closure and at the conclusion of the case.   Findings: Live female infant, Apgars 9/10, clear amniotic fluid, placenta ?evidence of abruption, 3 vessels, normal uterus, bilateral tubes and ovaries  Estimated Blood Loss: 227mL  IVF:  2000 mL LR         Drains: Foley catheter  Urine output: 50 mL clear         Specimens: Placenta to L&D [Pathology]         Implants: none         Complications:  None; patient tolerated the procedure well.         Disposition: PACU - hemodynamically stable.   Timberly Yott [name]

## 2019-05-07 NOTE — Progress Notes (Signed)
Kristen Reese is a 23 y.o. 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  Subjective: Patient starting to feel more uncomfortable  Objective: BP 113/74   Pulse 87   Temp 98.5 F (36.9 C) (Oral)   Resp 16   Ht 5' (1.524 m)   Wt 70.1 kg   LMP 08/04/2018   BMI 30.19 kg/m   FHT:  FHR: 125 bpm, variability: moderate,  accelerations:  Present,  decelerations:  Absent UC:   regular, every 3 minutes SVE:   Dilation: 3.5 Effacement (%): 70 Station: -1 Exam by:: Dr. Alwyn Pea AROM: Clear fluid  Labs: Lab Results  Component Value Date   WBC 14.7 (H) 05/07/2019   HGB 12.1 05/07/2019   HCT 36.1 05/07/2019   MCV 92.6 05/07/2019   PLT 208 05/07/2019    Assessment / Plan: Induction of labor due to fetal anomaly,  progressing well on pitocin  Labor: Progressing on Pitocin, will continue to increase ; AROM performed Preeclampsia:  no signs or symptoms of toxicity Fetal Wellbeing:  Category I Pain Control:  Labor support without medications and IV pain meds I/D:  n/a Anticipated MOD:  NSVD  Kristen Reese Kristen Reese 05/07/2019, 11:33 AM

## 2019-05-07 NOTE — Progress Notes (Signed)
Labor Progress Note  Kristen Reese is a 23 y.o. 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 Echo WNL on 01/08/2019, right pyelectasis and bilat ventricular EIF noted on growth scan 5/13, EFW @ that time was 4.11lbs, female fetus, GBS-. Covid -. 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 Pt endorse + Fm. Denies vaginal leakage. Denies vaginal bleeding. Denies feeling cxt's.   Subjective: Pt stable in bed sleeping and awake now, partner at bedside, pt endorses feeling cxt now. Wants to try for an all natural delivery but not opposed to an epidural.  Patient Active Problem List   Diagnosis Date Noted  . 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 Morning sickness 12/14/2018   Objective: BP 125/89   Pulse (!) 58   Temp 98.5 F (36.9 C) (Oral)   Resp 17   Ht 5' (1.524 m)   Wt 70.1 kg   LMP 08/04/2018   BMI 30.19 kg/m  No intake/output data recorded. No intake/output data recorded. NST: FHR baseline 120 bpm, Variability: moderate, Accelerations:present, Decelerations:  Absent= Cat 1/Reactive CTX:  irregular, every 1.5-4 minutes, lasting 60/100 seconds Uterus gravid, soft non tender, moderate to palpate with contractions.  SVE:  Dilation: 2 Effacement (%): 50 Station: -1 Exam by:: Hector Shade, RN   Assessment:  Kristen Reese is a 23 y.o. 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 Echo WNL on 01/08/2019, right pyelectasis and bilat ventricular EIF noted on growth scan 5/13, EFW @ that time was 4.11lbs, female fetus, GBS-. Covid -. 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 Pt feeling cxt and progressing off 2 Cytotec.  Patient Active Problem List   Diagnosis Date Noted  . 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 Morning sickness 12/14/2018   NICHD: Category 1  Membranes:  Intact, no s/s of infection  Induction:    Cytotec x2 @ 0030 & 0430  Pain management:               IV pain management: x0  Epidural placement: PRN  GBS Negative  Plan: Continue labor plan Continuous/intermittent monitoring Rest Ambulate Frequent position changes to facilitate fetal rotation and descent. Will reassess with cervical exam if necessary Anticipate labor progression and vaginal delivery.   Md Pinn aware of plan and verbalized agreement.   Noralyn Pick, NP-C, CNM, MSN 05/07/2019. 7:00 AM

## 2019-05-07 NOTE — Progress Notes (Signed)
MD Progress Note  Kristen Reese is a 23 y.o. G1P0000 at [redacted]w[redacted]d by LMP admitted for induction of labor due to fetal anomalous syndrome XYY. Called to bedside to evaluate patient for repetitive late decelerations.   Subjective: Patient comfortable with epidural  Objective: BP 110/65   Pulse 62   Temp 98.7 F (37.1 C) (Oral)   Resp 16   Ht 5' (1.524 m)   Wt 70.1 kg   LMP 08/04/2018   SpO2 99%   BMI 30.19 kg/m    FHT:  FHR: 130 bpm, variability: moderate,  accelerations:  Abscent,  decelerations:  Present repetitive late decelerations nadir 90s lasting 1 min with return to baseline UC:   irregular, every 3-5 minutes SVE:   Dilation: 5 Effacement (%): 80 Station: -1 Exam by:: Tyrene Nader  Labs: Lab Results  Component Value Date   WBC 14.7 (H) 05/07/2019   HGB 12.1 05/07/2019   HCT 36.1 05/07/2019   MCV 92.6 05/07/2019   PLT 208 05/07/2019    Assessment / Plan: 23 year old G1P0 SIUP at 39 weeks 3 days in early active labor  Fetal heart tracing non-reassuring Cat 2/3 with repetitive late decelerations after contractions.  Discussed at length with the patient my recommendation to proceed with a primary cesarean delivery.  We discussed the risks of delivery some of which include bleeding, infection, injury to vessels and organs. She voiced understanding and agrees to proceed.  Consent signed, witnessed and placed into chart.     [place], [place] 05/07/2019, 4:52 PM

## 2019-05-07 NOTE — Lactation Note (Signed)
This note was copied from a baby's chart. Lactation Consultation Note  Patient Name: Kristen Reese [id number]'[id number] Date: 05/07/2019 Reason for consult: 1st time breastfeeding;Term P1, 5 hour female infant. Mom had vomiting while LC in room, she had hyperemesis through out her pregnancy LC alert nurse of vomiting.   Per mom, she has DEBP at home. Mom taught back hand expression and colostrum is present in both breast. Per mom infant has been latching well to breast, infant breastfeed for 15 minutes in L&D and 15 minutes at 8:15 pm LC did not observe latch at this time.  Infant is not cuing to breastfeed.  Mom knows to breastfeed infant according hunger cues, 8 to 12 times within 24 hours and on demand. Mom knows to call Nurse or [place] if she has any questions, concerns or need assistance with latching infant to breast. LC discussed I & O. Mom made aware of O/P services, breastfeeding support groups, community resources, and our phone # for post-discharge questions.   Maternal Data Formula Feeding for Exclusion: No Has patient been taught Hand Expression?: Yes(Mom has colostrum present both breast.)  Feeding Feeding Type: Breast Fed  LATCH Score                   Interventions Interventions: Breast feeding basics reviewed;Position options;Hand express;Expressed milk  Lactation Tools Discussed/Used WIC Program: No   Consult Status Consult Status: Follow-up Date: 05/08/19 Follow-up type: In-patient    Vicente Serene 05/07/2019, 10:59 PM

## 2019-05-08 ENCOUNTER — Encounter (HOSPITAL_COMMUNITY): Payer: Self-pay | Admitting: Obstetrics & Gynecology

## 2019-05-08 LAB — CBC
HCT: 30.3 % — ABNORMAL LOW (ref 36.0–46.0)
Hemoglobin: 10.3 g/dL — ABNORMAL LOW (ref 12.0–15.0)
MCH: 31.9 pg (ref 26.0–34.0)
MCHC: 34 g/dL (ref 30.0–36.0)
MCV: 93.8 fL (ref 80.0–100.0)
Platelets: 179 10*3/uL (ref 150–400)
RBC: 3.23 MIL/uL — ABNORMAL LOW (ref 3.87–5.11)
RDW: 14.7 % (ref 11.5–15.5)
WBC: 31.7 10*3/uL — ABNORMAL HIGH (ref 4.0–10.5)
nRBC: 0 % (ref 0.0–0.2)

## 2019-05-08 NOTE — Progress Notes (Signed)
Subjective: Postpartum Day 1: Cesarean Delivery fetal intolerance of labor Patient reports tolerating PO, + flatus and no problems voiding.    Objective: Vital signs in last 24 hours: Temp:  [97.7 F (36.5 C)-98.9 F (37.2 C)] 98.3 F (36.8 C) (07/04 0900) Pulse Rate:  [49-96] 68 (07/04 0900) Resp:  [16-23] 16 (07/04 0900) BP: (94-151)/(43-98) 121/80 (07/04 0900) SpO2:  [96 %-100 %] 100 % (07/04 0900)  Physical Exam:  General: alert, cooperative and no distress Lochia: appropriate Uterine Fundus: firm Incision: healing well DVT Evaluation: No evidence of DVT seen on physical exam.  Recent Labs    05/07/19 0036 05/08/19 0432  HGB 12.1 10.3*  HCT 36.1 30.3*    Assessment/Plan: Status post Cesarean section. Doing well postoperatively.  Continue current care.  Kristen Reese 05/08/2019, 11:50 AM

## 2019-05-08 NOTE — Lactation Note (Signed)
This note was copied from a baby's chart. Lactation Consultation Note  Patient Name: Kristen Reese [id number] Date: 05/08/2019 Reason for consult: Term;Primapara;1st time breastfeeding  P1 mother whose infant is now 71 hours old.  Mother was getting ready to latch baby onto the breast when I arrived.  She has been having some difficulty with the left breast so I suggested we try that side first.    Mother's breasts are soft and non tender and nipples are everted and intact.  Positioned mother appropriately and assisted baby to latch in the football hold on the left breast easily.  He had a wide mouth, flanged lips and began sucking immediately with good sucking bursts.  Demonstrated breast compresseions and mother was able to return demonstrate.  Observed baby feeding well for 12 minutes before he self released.  Mother held him in her arms and he fell asleep.  Reviewed basic breast feeding concepts with this first time mother.  She is very receptive to learning and asked many questions.  Encouraged her to continue feeding 8-12 times/24 hours or sooner if he shows feeding cues.  Colostrum container at bedside for any EBM she obtains with hand expression.  Milk storage times reviewed and finger feeding demonstrated.  Mother has a DEBP for home use.  She will return to work in 34 weeks.  Mother will call her RN/LC for latch assistance as needed.   Maternal Data Formula Feeding for Exclusion: No Has patient been taught Hand Expression?: Yes Does the patient have breastfeeding experience prior to this delivery?: No  Feeding Feeding Type: Breast Fed  LATCH Score Latch: Grasps breast easily, tongue down, lips flanged, rhythmical sucking.  Audible Swallowing: A few with stimulation  Type of Nipple: Everted at rest and after stimulation  Comfort (Breast/Nipple): Soft / non-tender  Hold (Positioning): Assistance needed to correctly position infant at breast and maintain latch.  LATCH Score:  8  Interventions Interventions: Breast feeding basics reviewed;Assisted with latch;Skin to skin;Breast massage;Hand express;Breast compression;Adjust position;Position options;Support pillows  Lactation Tools Discussed/Used     Consult Status Consult Status: Follow-up Date: 05/09/19 Follow-up type: In-patient    [name] 05/08/2019, 1:45 PM

## 2019-05-08 NOTE — Progress Notes (Signed)
Taken to 5th floor nursery to visit baby per request.

## 2019-05-09 MED ORDER — IBUPROFEN 600 MG PO TABS: 600.0000 mg | ORAL_TABLET | Freq: Four times a day (QID) | ORAL | 0 refills | Status: DC | PRN

## 2019-05-09 MED ORDER — OXYCODONE HCL 5 MG PO TABS: 5.0000 mg | ORAL_TABLET | Freq: Four times a day (QID) | ORAL | 0 refills | Status: AC | PRN

## 2019-05-09 NOTE — Discharge Summary (Addendum)
OB Discharge Summary     Patient Name: Kristen Reese DOB: 1996-05-17 MRN: [id number]  Date of admission: 05/07/2019 Delivering MD: Sanjuana Kava   Date of discharge: 05/09/2019  Admitting diagnosis: pregnancy Intrauterine pregnancy: [redacted]w[redacted]d     Secondary diagnosis:  Active Problems:   Double Y syndrome      Discharge diagnosis: Term Pregnancy Delivered                                                                                                Post partum procedures:n/a  Augmentation: AROM, Pitocin and Cytotec  Complications: None  Hospital course:  Induction of Labor With Cesarean Section  23 y.o. yo G1P1001 at [redacted]w[redacted]d was admitted to the hospital 05/07/2019 for induction of labor. Patient had a labor course significant for nonreassuring FHTs. The patient went for cesarean section due to Non-Reassuring FHR, and delivered a Viable infant,05/07/2019  Membrane Rupture Time/Date: 11:23 AM ,05/07/2019   Details of operation can be found in separate operative Note.  Patient had an uncomplicated postpartum course. She is ambulating, tolerating a regular diet, passing flatus, and urinating well.  Patient is discharged home in stable condition on 05/09/19.                                    Physical exam  Vitals:   05/08/19 1439 05/08/19 1700 05/08/19 2123 05/09/19 0529  BP: 107/66 110/68 119/77 122/76  Pulse: 65 62 94 69  Resp: 15 17 18 18   Temp: 98 F (36.7 C) 98 F (36.7 C) 99.7 F (37.6 C) 98.3 F (36.8 C)  TempSrc: Oral Oral Oral Oral  SpO2:    100%  Weight:      Height:       General: alert, cooperative and no distress Lochia: appropriate Uterine Fundus: firm Incision: Healing well with no significant drainage, No significant erythema, Dressing is clean, dry, and intact DVT Evaluation: No evidence of DVT seen on physical exam. Negative Homan's sign. No cords or calf tenderness. No significant calf/ankle edema.  Labs: Lab Results  Component Value Date   WBC 31.7 (H)  05/08/2019   HGB 10.3 (L) 05/08/2019   HCT 30.3 (L) 05/08/2019   MCV 93.8 05/08/2019   PLT 179 05/08/2019   CMP Latest Ref Rng & Units 03/22/2019  Glucose 70 - 99 mg/dL 72  BUN 6 - 20 mg/dL <5(L)  Creatinine 0.44 - 1.00 mg/dL 0.41(L)  Sodium 135 - 145 mmol/L 138  Potassium 3.5 - 5.1 mmol/L 3.7  Chloride 98 - 111 mmol/L 104  CO2 22 - 32 mmol/L 22  Calcium 8.9 - 10.3 mg/dL 9.3  Total Protein 6.5 - 8.1 g/dL 6.3(L)  Total Bilirubin 0.3 - 1.2 mg/dL 1.1  Alkaline Phos 38 - 126 U/L 95  AST 15 - 41 U/L 26  ALT 0 - 44 U/L 27    Discharge instruction: per After Visit Summary and "Baby and Me Booklet".  After visit meds:  Allergies as of 05/09/2019   No Known Allergies  Medication List    STOP taking these medications   Doxylamine-Pyridoxine 10-10 MG Tbec   ondansetron 8 MG disintegrating tablet Commonly known as: Zofran ODT   promethazine 12.5 MG tablet Commonly known as: PHENERGAN   promethazine 25 MG suppository Commonly known as: PHENERGAN   valACYclovir 500 MG tablet Commonly known as: VALTREX     TAKE these medications   Concept OB 130-92.4-1 MG Caps Take 1 tablet by mouth daily.   ibuprofen 600 MG tablet Commonly known as: ADVIL Take 1 tablet (600 mg total) by mouth every 6 (six) hours as needed for moderate pain.   oxyCODONE 5 MG immediate release tablet Commonly known as: Oxy IR/ROXICODONE Take 1 tablet (5 mg total) by mouth every 6 (six) hours as needed for up to 7 days for severe pain.       Diet: routine diet  Activity: Advance as tolerated. Pelvic rest for 6 weeks.   Outpatient follow up:6 weeks Follow up Appt:No future appointments. Follow up Visit:No follow-ups on file.  Postpartum contraception: Undecided  Newborn Data: Live born female  Birth Weight: 7 lb 3.5 oz (3274 g) APGAR: 9, 10  Newborn Delivery   Birth date/time: 05/07/2019 17:24:00 Delivery type: C-Section, Low Transverse Trial of labor: Yes C-section categorization: Primary       Baby Feeding: Breast Disposition:home with mother   05/09/2019 Kristen Reese, CNM  Patient did not go home yesterday because the baby was not being discharged.  She is "feeling great" today and ready to go home.  Her BP is mildly elevated however as an isolated elevation.  She denies any visual changes or abdominal pain and states she is happy to rto later this week for a BP check.  Will also have BP rechecked prior to discharge and if greater than or equal to 140/90, [facility]  I will have the office arrange this.  Her exam is unchanged.

## 2019-05-09 NOTE — Lactation Note (Signed)
This note was copied from a baby's chart. Lactation Consultation Note  Patient Name: Kristen Reese JOINO'M Date: 05/09/2019 Reason for consult: Term;Primapara;1st time breastfeeding  P1 mother whose infant is now 76 hours old.  Baby has a 9% weight loss.  Mother was ready to awaken baby for feeding.  Offered to assist with latching and she accepted.  Mother's breasts are soft and non tender and nipples are everted and intact.  Reviewed breast feeding and latching basics.  Mother latched baby in the cradle hold without assistance.  Upon observation he had a shallow latch.  Helped mother obtain a deeper latch and positioned him closer to her.  Mother denied pain and was able to observe a better latch.  Demonstrated breast compressions.  Due to the weight loss I offered to initiate the DEBP to obtain colostrum for supplementation.  Mother was interested in doing this.  Instructed her to call me back after she finished feeding baby for pump set up.    Mother has a DEBP for home use. RN updated.    Maternal Data Formula Feeding for Exclusion: No Has patient been taught Hand Expression?: Yes Does the patient have breastfeeding experience prior to this delivery?: No  Feeding Feeding Type: Breast Fed  LATCH Score Latch: Grasps breast easily, tongue down, lips flanged, rhythmical sucking.  Audible Swallowing: A few with stimulation  Type of Nipple: Everted at rest and after stimulation  Comfort (Breast/Nipple): Soft / non-tender  Hold (Positioning): Assistance needed to correctly position infant at breast and maintain latch.  LATCH Score: 8  Interventions Interventions: Breast feeding basics reviewed;Assisted with latch;Skin to skin;Breast massage;Breast compression;DEBP;Position options;Support pillows  Lactation Tools Discussed/Used     Consult Status Consult Status: Follow-up Date: 05/10/19 Follow-up type: In-patient    Kristen Reese 05/09/2019, 8:52 AM

## 2019-05-09 NOTE — Discharge Instructions (Signed)
Cesarean Delivery, Care After This sheet gives you information about how to care for yourself after your procedure. Your health care provider may also give you more specific instructions. If you have problems or questions, contact your health care provider. What can I expect after the procedure? After the procedure, it is common to have:  A small amount of blood or clear fluid coming from the incision.  Some redness, swelling, and pain in your incision area.  Some abdominal pain and soreness.  Vaginal bleeding (lochia). Even though you did not have a vaginal delivery, you will still have vaginal bleeding and discharge.  Pelvic cramps.  Fatigue. You may have pain, swelling, and discomfort in the tissue between your vagina and your anus (perineum) if:  Your C-section was unplanned, and you were allowed to labor and push.  An incision was made in the area (episiotomy) or the tissue tore during attempted vaginal delivery. Follow these instructions at home: Incision care   Follow instructions from your health care provider about how to take care of your incision. Make sure you: ? Wash your hands with soap and water before you change your bandage (dressing). If soap and water are not available, use hand sanitizer. ? If you have a dressing, change it or remove it as told by your health care provider. ? Leave stitches (sutures), skin staples, skin glue, or adhesive strips in place. These skin closures may need to stay in place for 2 weeks or longer. If adhesive strip edges start to loosen and curl up, you may trim the loose edges. Do not remove adhesive strips completely unless your health care provider tells you to do that.  Check your incision area every day for signs of infection. Check for: ? More redness, swelling, or pain. ? More fluid or blood. ? Warmth. ? Pus or a bad smell.  Do not take baths, swim, or use a hot tub until your health care provider says it's okay. Ask your health  care provider if you can take showers.  When you cough or sneeze, hug a pillow. This helps with pain and decreases the chance of your incision opening up (dehiscing). Do this until your incision heals. Medicines  Take over-the-counter and prescription medicines only as told by your health care provider.  If you were prescribed an antibiotic medicine, take it as told by your health care provider. Do not stop taking the antibiotic even if you start to feel better.  Do not drive or use heavy machinery while taking prescription pain medicine. Lifestyle  Do not drink alcohol. This is especially important if you are breastfeeding or taking pain medicine.  Do not use any products that contain nicotine or tobacco, such as cigarettes, e-cigarettes, and chewing tobacco. If you need help quitting, ask your health care provider. Eating and drinking  Drink at least 8 eight-ounce glasses of water every day unless told not to by your health care provider. If you breastfeed, you may need to drink even more water.  Eat high-fiber foods every day. These foods may help prevent or relieve constipation. High-fiber foods include: ? Whole grain cereals and breads. ? Brown rice. ? Beans. ? Fresh fruits and vegetables. Activity   If possible, have someone help you care for your baby and help with household activities for at least a few days after you leave the hospital.  Return to your normal activities as told by your health care provider. Ask your health care provider what activities are safe for  you. °· Rest as much as possible. Try to rest or take a nap while your baby is sleeping. °· Do not lift anything that is heavier than 10 lbs (4.5 kg), or the limit that you were told, until your health care provider says that it is safe. °· Talk with your health care provider about when you can engage in sexual activity. This may depend on your: °? Risk of infection. °? How fast you heal. °? Comfort and desire to  engage in sexual activity. °General instructions °· Do not use tampons or douches until your health care provider approves. °· Wear loose, comfortable clothing and a supportive and well-fitting bra. °· Keep your perineum clean and dry. Wipe from front to back when you use the toilet. °· If you pass a blood clot, save it and call your health care provider to discuss. Do not flush blood clots down the toilet before you get instructions from your health care provider. °· Keep all follow-up visits for you and your baby as told by your health care provider. This is important. °Contact a health care provider if: °· You have: °? A fever. °? Bad-smelling vaginal discharge. °? Pus or a bad smell coming from your incision. °? Difficulty or pain when urinating. °? A sudden increase or decrease in the frequency of your bowel movements. °? More redness, swelling, or pain around your incision. °? More fluid or blood coming from your incision. °? A rash. °? Nausea. °? Little or no interest in activities you used to enjoy. °? Questions about caring for yourself or your baby. °· Your incision feels warm to the touch. °· Your breasts turn red or become painful or hard. °· You feel unusually sad or worried. °· You vomit. °· You pass a blood clot from your vagina. °· You urinate more than usual. °· You are dizzy or light-headed. °Get help right away if: °· You have: °? Pain that does not go away or get better with medicine. °? Chest pain. °? Difficulty breathing. °? Blurred vision or spots in your vision. °? Thoughts about hurting yourself or your baby. °? New pain in your abdomen or in one of your legs. °? A severe headache. °· You faint. °· You bleed from your vagina so much that you fill more than one sanitary pad in one hour. Bleeding should not be heavier than your heaviest period. °Summary °· After the procedure, it is common to have pain at your incision site, abdominal cramping, and slight bleeding from your vagina. °· Check  your incision area every day for signs of infection. °· Tell your health care provider about any unusual symptoms. °· Keep all follow-up visits for you and your baby as told by your health care provider. °This information is not intended to replace advice given to you by your health care provider. Make sure you discuss any questions you have with your health care provider. °Document Released: 07/13/2002 Document Revised: 04/29/2018 Document Reviewed: 04/29/2018 °Elsevier Patient Education © 2020 [employer] ° ° °Postpartum Baby Blues °The postpartum period begins right after the birth of a baby. During this time, there is often a lot of joy and excitement. It is also a time of many changes in the life of the parents. No matter how many times a mother gives birth, each child brings new challenges to the family, including different ways of relating to one another. °It is common to have feelings of excitement along with confusing changes in moods, emotions,   and thoughts. You may feel happy one minute and sad or stressed the next. These feelings of sadness usually happen in the period right after you have your baby, and they go away within a week or two. This is called the "baby blues." What are the causes? There is no known cause of baby blues. It is likely caused by a combination of factors. However, changes in hormone levels after childbirth are believed to trigger some of the symptoms. Other factors that can play a role in these mood changes include:  Lack of sleep.  Stressful life events, such as poverty, caring for a loved one, or death of a loved one.  Genetics. What are the signs or symptoms? Symptoms of this condition include:  Brief changes in mood, such as going from extreme happiness to sadness.  Decreased concentration.  Difficulty sleeping.  Crying spells and tearfulness.  Loss of appetite.  Irritability.  Anxiety. If the symptoms of baby blues last for more than 2 weeks or  become more severe, you may have postpartum depression. How is this diagnosed? This condition is diagnosed based on an evaluation of your symptoms. There are no medical or lab tests that lead to a diagnosis, but there are various questionnaires that a health care provider may use to identify women with the baby blues or postpartum depression. How is this treated? Treatment is not needed for this condition. The baby blues usually go away on their own in 1-2 weeks. Social support is often all that is needed. You will be encouraged to get adequate sleep and rest. Follow these instructions at home: Lifestyle      Get as much rest as you can. Take a nap when the baby sleeps.  Exercise regularly as told by your health care provider. Some women find yoga and walking to be helpful.  Eat a balanced and nourishing diet. This includes plenty of fruits and vegetables, whole grains, and lean proteins.  Do little things that you enjoy. Have a cup of tea, take a bubble bath, read your favorite magazine, or listen to your favorite music.  Avoid alcohol.  Ask for help with household chores, cooking, grocery shopping, or running errands. Do not try to do everything yourself. Consider hiring a postpartum doula to help. This is a professional who specializes in providing support to new mothers.  Try not to make any major life changes during pregnancy or right after giving birth. This can add stress. General instructions  Talk to people close to you about how you are feeling. Get support from your partner, family members, friends, or other new moms. You may want to join a support group.  Find ways to cope with stress. This may include: ? Writing your thoughts and feelings in a journal. ? Spending time outside. ? Spending time with people who make you laugh.  Try to stay positive in how you think. Think about the things you are grateful for.  Take over-the-counter and prescription medicines only as told  by your health care provider.  Let your health care provider know if you have any concerns.  Keep all postpartum visits as told by your health care provider. This is important. Contact a health care provider if:  Your baby blues do not go away after 2 weeks. Get help right away if:  You have thoughts of taking your own life (suicidal thoughts).  You think you may harm the baby or other people.  You see or hear things that are not  there (hallucinations). Summary  After giving birth, you may feel happy one minute and sad or stressed the next. Feelings of sadness that happen right after the baby is born and go away after a week or two are called the "baby blues."  You can manage the baby blues by getting enough rest, eating a healthy diet, exercising, spending time with supportive people, and finding ways to cope with stress.  If feelings of sadness and stress last longer than 2 weeks or get in the way of caring for your baby, talk to your health care provider. This may mean you have postpartum depression. This information is not intended to replace advice given to you by your health care provider. Make sure you discuss any questions you have with your health care provider. Document Released: 07/25/2004 Document Revised: 02/12/2019 Document Reviewed: 12/17/2016 Elsevier Patient Education  2020 [employer].

## 2019-05-10 NOTE — Lactation Note (Signed)
This note was copied from a baby's chart. Lactation Consultation Note  Patient Name: Kristen Reese Today's Date: 05/10/2019 Reason for consult: Infant weight loss;Term;1st time breastfeeding;[place] Baby is 64 hours  LC updated the doc flow sheets per mom  Baby awake and hungry and mom started to latch without support and LC offered to assist and mom receptive. LC offered pillow support, and  Baby latched with depth , multiple swallows, and LC showed mom how to  Compress the breast as the baby feeds to enhance let down and flow.  Mom noted the baby picking up with feeding pattern.  LC discussed nutritive feeding patterns vs non - nutritive feeding patterns And the importance of the baby hanging out latched. Importance of STS feedings  Until the baby can stay awake for majority of feeding, back to birth weight and gaining  Steadily.  Mom denies sore ness, sore nipple and engorgement prevention and tx reviewed.  LC instructed mom on the use of the hand pump and mom has the DEBP kit she pumped x 1 With 15 ml of EBM.  Per mom has a DEBP at home , not sure of the name.  LC reviewed [place] resources for [facility].  [place] praised mom for her breast feeding efforts.   Maternal Data Has patient been taught Hand Expression?: Yes  Feeding Feeding Type: Breast Fed  LATCH Score Latch: Grasps breast easily, tongue down, lips flanged, rhythmical sucking.  Audible Swallowing: Spontaneous and intermittent  Type of Nipple: Everted at rest and after stimulation  Comfort (Breast/Nipple): Filling, red/small blisters or bruises, mild/mod discomfort  Hold (Positioning): Assistance needed to correctly position infant at breast and maintain latch.  LATCH Score: 8  Interventions Interventions: Breast feeding basics reviewed;Assisted with latch;Skin to skin;Breast massage;Hand express;Breast compression;Adjust position;Support pillows;Position options;Hand pump  Lactation Tools Discussed/Used Tools:  Shells;Pump Shell Type: Inverted Breast pump type: Manual;Double-Electric Breast Pump WIC Program: No Pump Review: Milk Storage;Setup, frequency, and cleaning Initiated by:: hand pump - Date initiated:: 05/10/19   Consult Status Consult Status: Complete Date: 05/10/19    Myer Haff 05/10/2019, 9:34 AM

## 2019-10-25 IMAGING — US US MFM OB FOLLOW UP
1 series · 13 of 28 positions shown · non-contrast
Comparison: none

[Series 1: us mfm ob follow up · 13 of 32 slices shown]
[im 2/32]
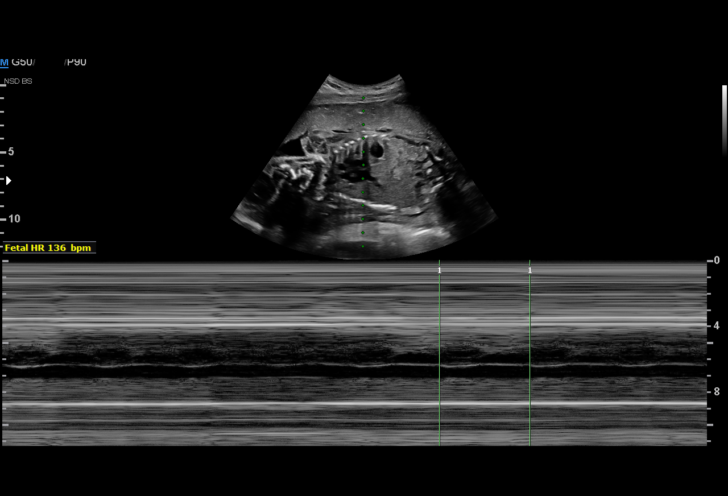
[im 4/32]
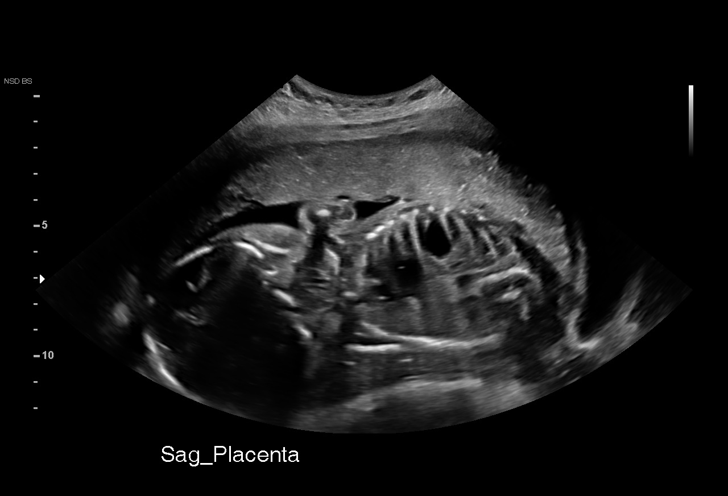
[im 6/32]
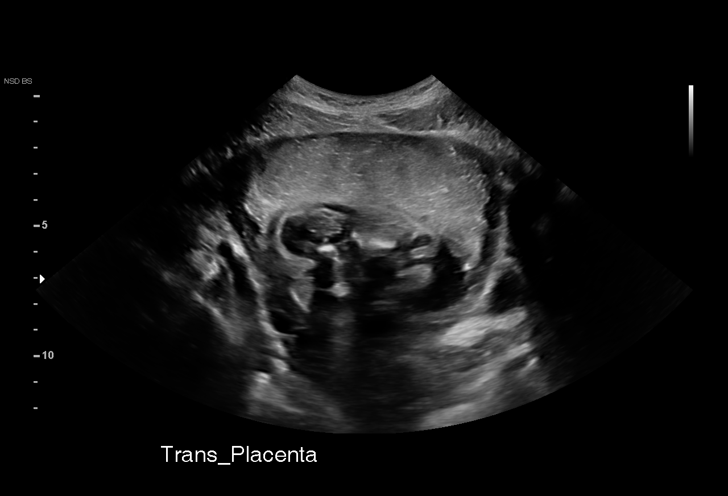
[im 9/32]
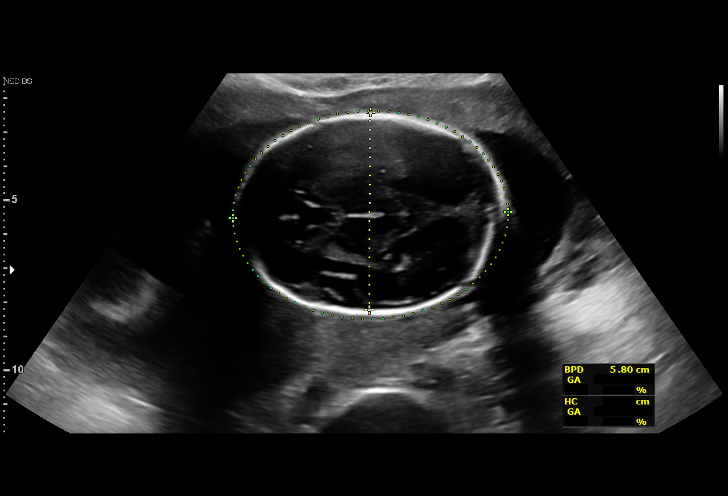
[im 11/32]
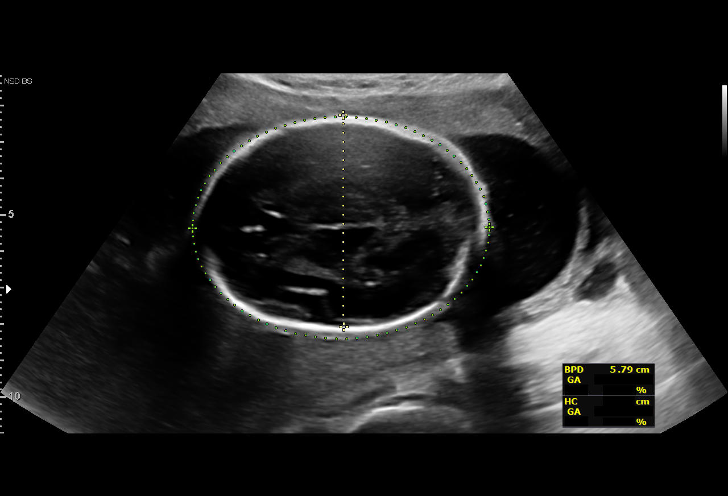
[im 13/32]
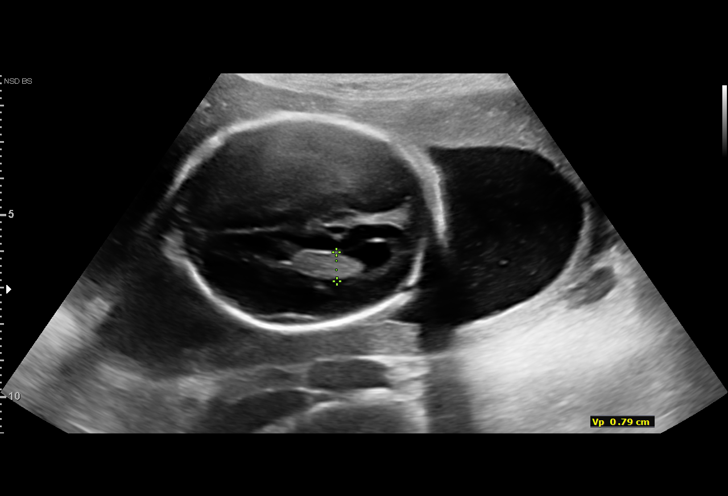
[im 17/32]
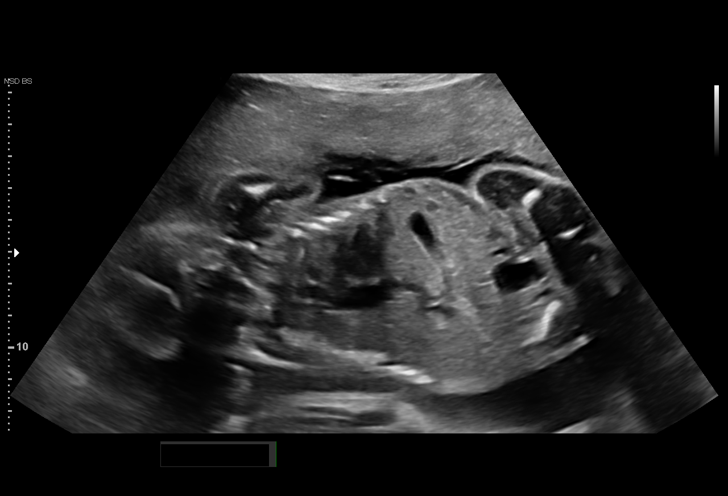
[im 19/32]
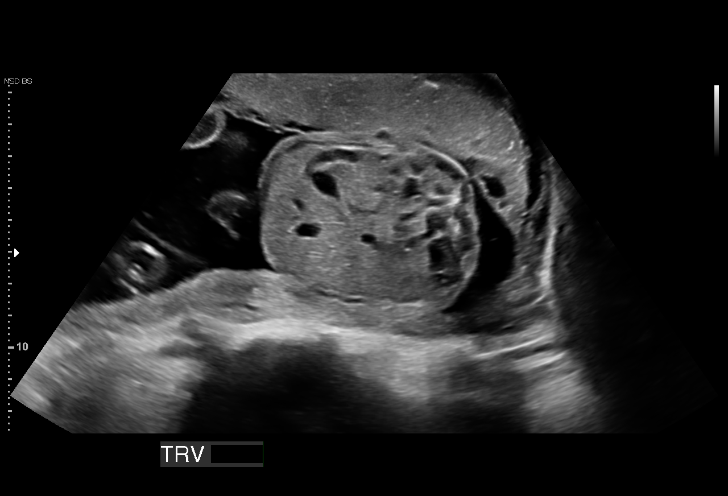
[im 21/32]
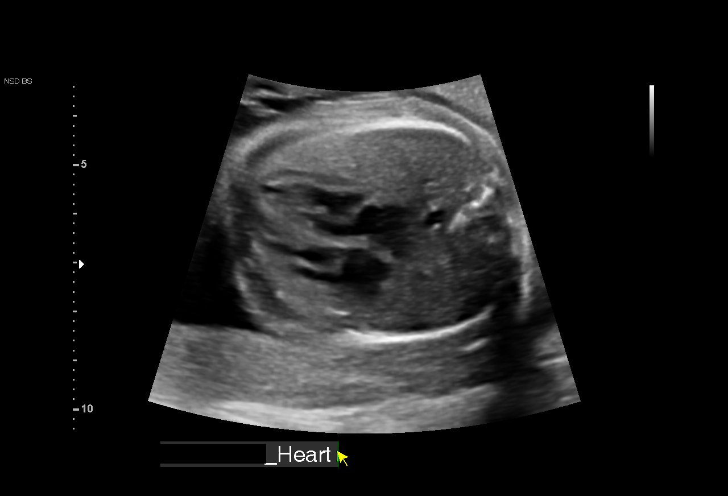
[im 23/32]
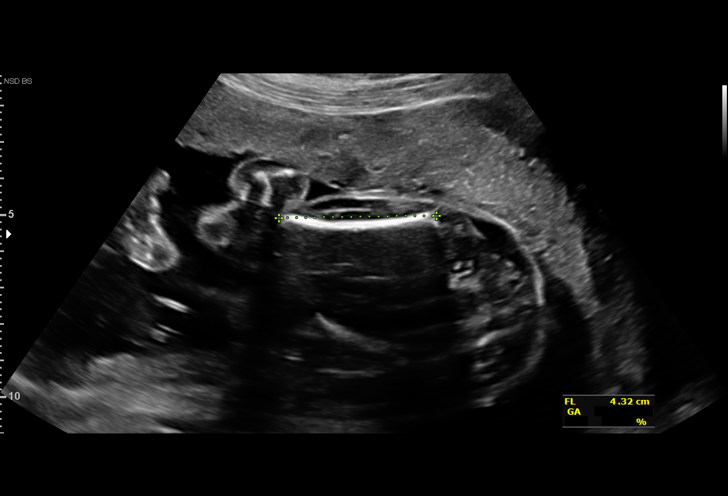
[im 26/32]
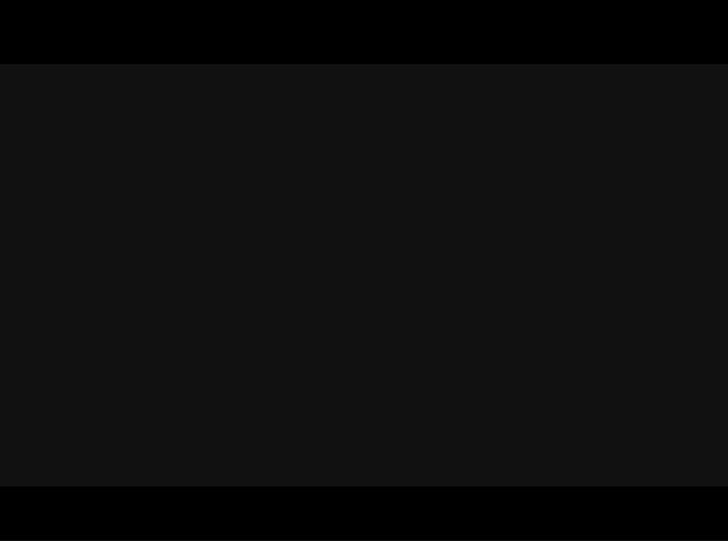
[im 28/32]
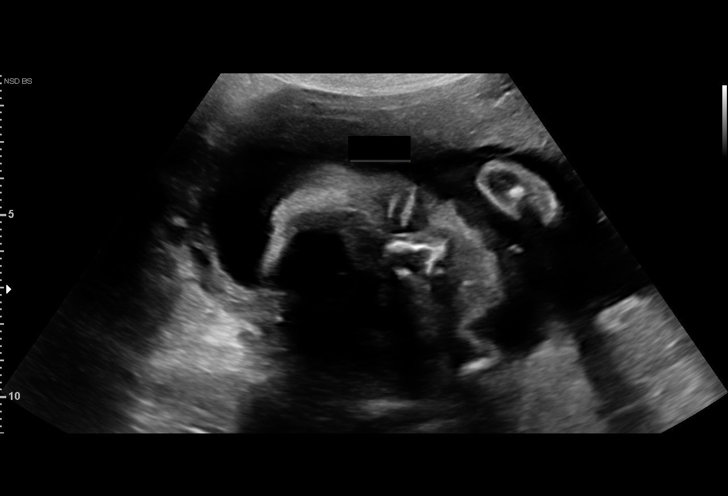
[im 30/32]
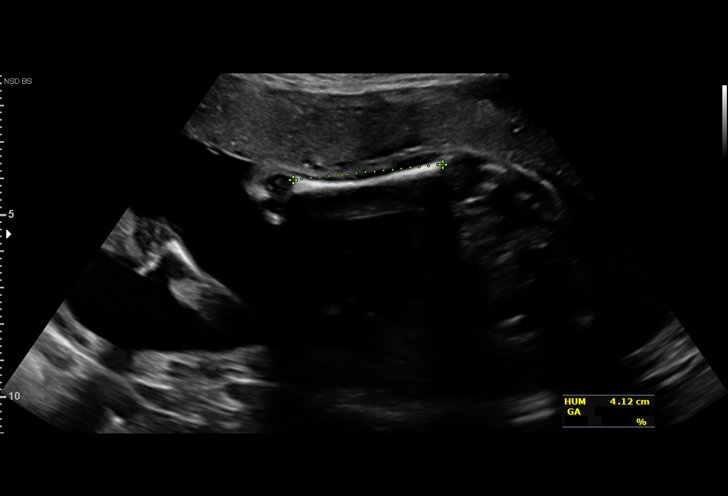

[13 of 28 positions shown; findings below may reference images not displayed]

#130

                                                       NOSA
 ----------------------------------------------------------------------

 ----------------------------------------------------------------------
Indications

  Encounter for antenatal screening for
  malformations
  24 weeks gestation of pregnancy
  Abnormal biochemical screen (NIPS-XYY)
  Fetal abnormality - other known or
  suspected (EIF and thickened nuchal fold)
  Seizure disorder
 ----------------------------------------------------------------------
Fetal Evaluation

 Num Of Fetuses:         1
 Fetal Heart Rate(bpm):  136
 Cardiac Activity:       Observed
 Presentation:           Breech
 Placenta:               Anterior
 P. Cord Insertion:      Previously Visualized

 Amniotic Fluid
 AFI FV:      Within normal limits

                             Largest Pocket(cm)

Biometry

 BPD:      58.1  mm     G. Age:  23w 6d         31  %    CI:        68.18   %    70 - 86
                                                         FL/HC:      19.4   %    18.7 -
 HC:       225   mm     G. Age:  24w 4d         46  %    HC/AC:      1.08        1.05 -
 AC:      207.9  mm     G. Age:  25w 3d         77  %    FL/BPD:     75.0   %    71 - 87
 FL:       43.6  mm     G. Age:  24w 2d         43  %    FL/AC:      21.0   %    20 - 24
 HUM:        41  mm     G. Age:  24w 6d         58  %
 LV:        8.1  mm
 Est. FW:     737  gm    1 lb 10 oz      65  %
OB History

 Gravidity:    1         Term:   0        Prem:   0        SAB:   0
 TOP:          0       Ectopic:  0        Living: 0
Gestational Age

 LMP:           24w 1d        Date:  08/04/18                 EDD:   05/11/19
 U/S Today:     24w 4d                                        EDD:   05/08/19
 Best:          24w 1d     Det. By:  LMP  (08/04/18)          EDD:   05/11/19
Anatomy

 Cranium:               Appears normal         LVOT:                   Previously seen
 Cavum:                 Previously seen        Aortic Arch:            Previously seen
 Ventricles:            Appears normal         Ductal Arch:            Previously seen
 Choroid Plexus:        Previously seen        Diaphragm:              Appears normal
 Cerebellum:            Previously seen        Stomach:                Appears normal, left
                                                                       sided
 Posterior Fossa:       Previously seen        Abdomen:                Appears normal
 Nuchal Fold:           Previously seen as     Abdominal Wall:         Previously seen
                        enlarged 8 mm
 Face:                  Orbits and profile     Cord Vessels:           Previously seen
                        previously seen
 Lips:                  Previously seen        Kidneys:                Appear normal
 Palate:                Previously seen        Bladder:                Appears normal
 Thoracic:              Previously seen        Spine:                  Previously seen
 Heart:                 Bilateral              Upper Extremities:      Previously seen
                        echogenic foci
 RVOT:                  Previously seen        Lower Extremities:      Previously seen

 Other:  Heels, right 5th digit, and nasal bone previously visualized.
Cervix Uterus Adnexa

 Cervix
 Length:           3.52  cm.
 Normal appearance by transabdominal scan.

 Left Ovary
 Previously seen.

 Right Ovary
 Previously seen
Impression

 Patient had amniocentesis on her last visit following abnormal
 NIPT. Fetal karyotype is reported as 46,XYY. Patient was
 counseled by our genetic counselor.
 Fetal growth is appropriate for gestational age. Amniotic fluid
 is normal and good fetal activity is seen.
 I gave her a copy of karyotype report and explained the
 abnormal chromosomes (images).
Recommendations

 An appointment was made for her to return in 4 weeks for
 fetal growth assessment.
                 Fraga, [place]

## 2019-11-22 IMAGING — US US MFM OB FOLLOW UP
1 series · 14 of 28 positions shown · non-contrast
Comparison: none

[Series 1: us mfm ob follow up · 14 of 29 slices shown]
[im 2/29]
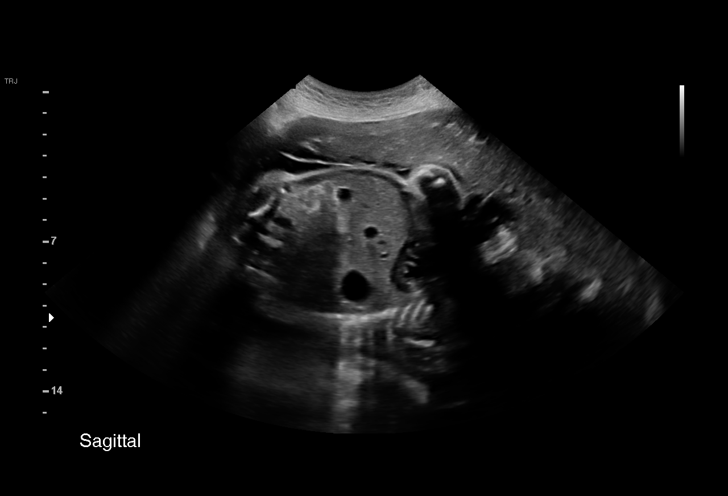
[im 4/29]
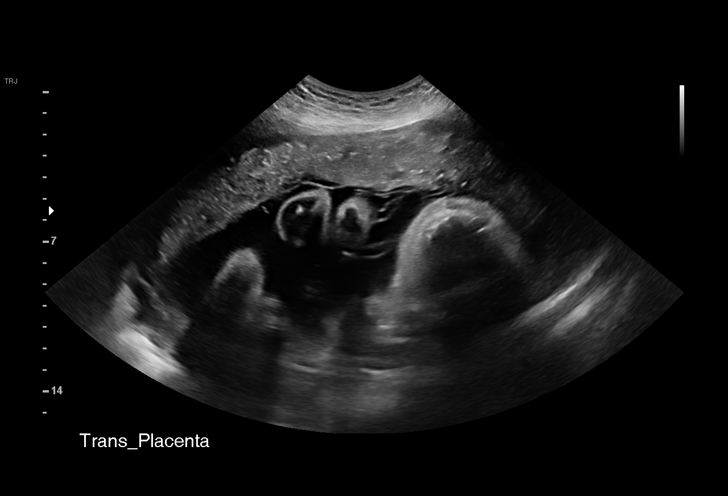
[im 6/29]
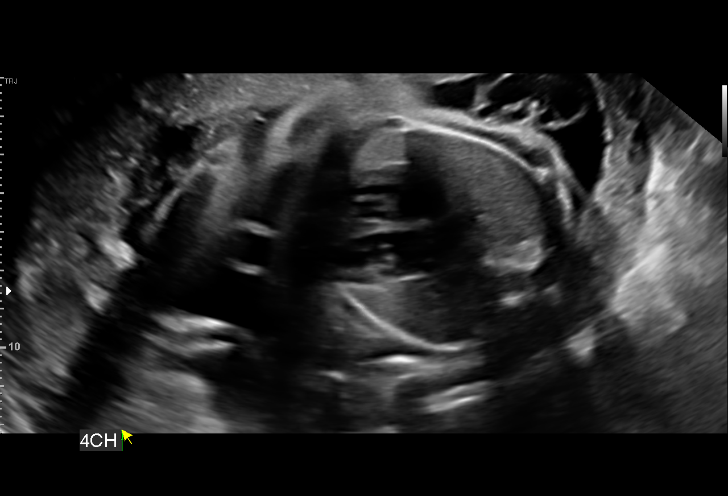
[im 8/29]
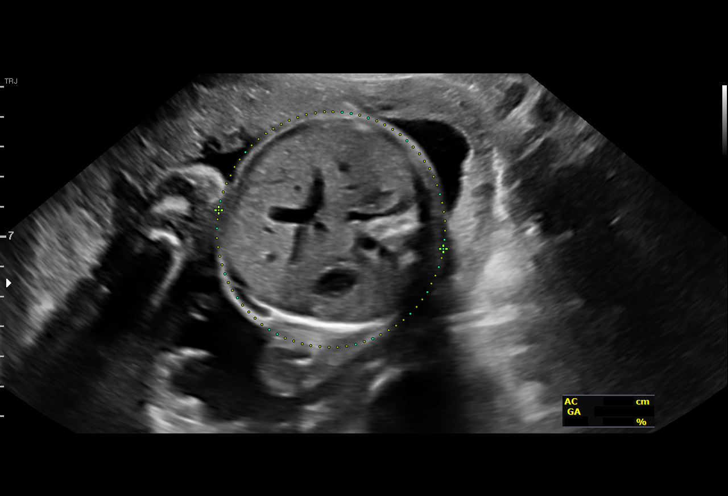
[im 10/29]
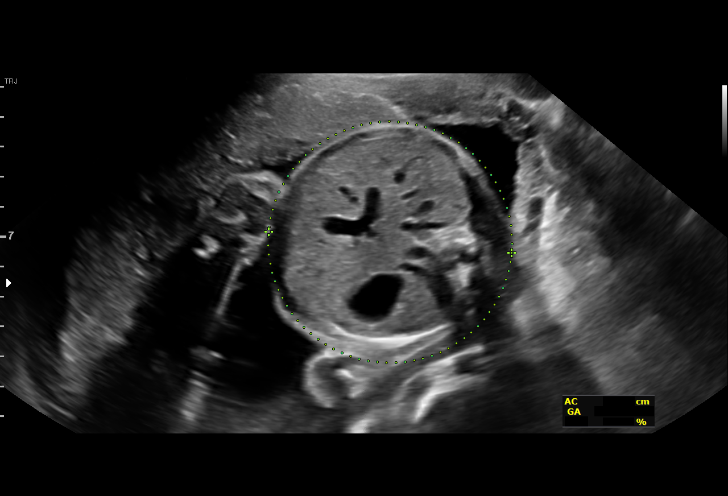
[im 12/29]
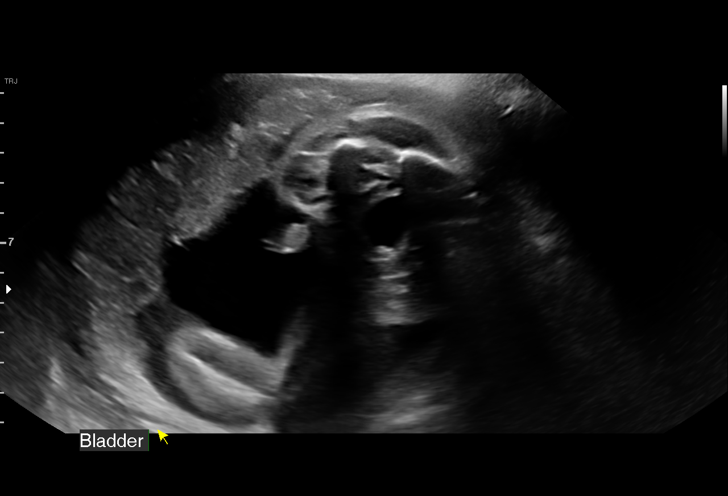
[im 14/29]
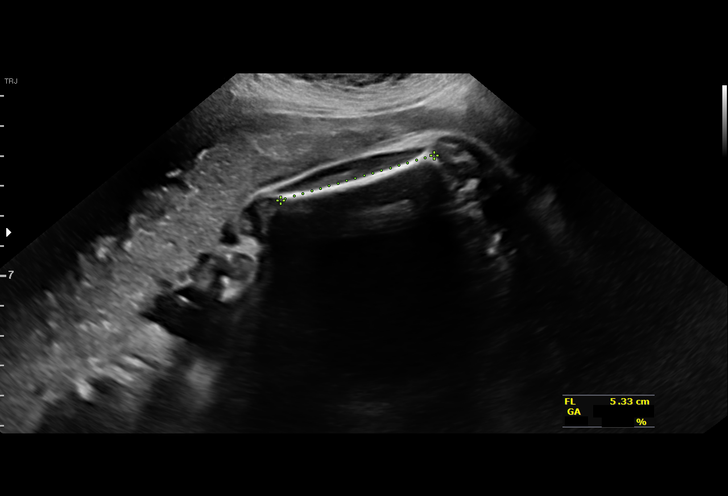
[im 16/29]
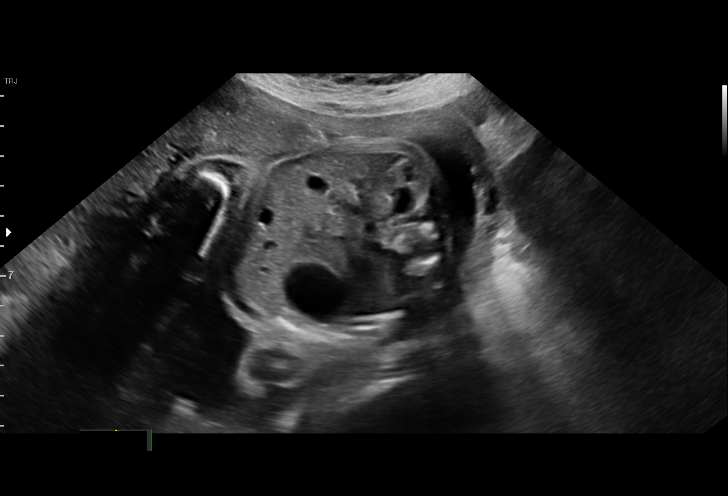
[im 18/29]
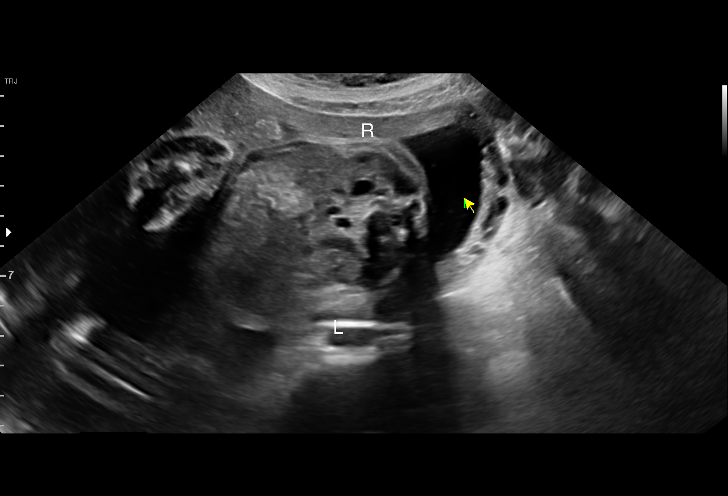
[im 20/29]
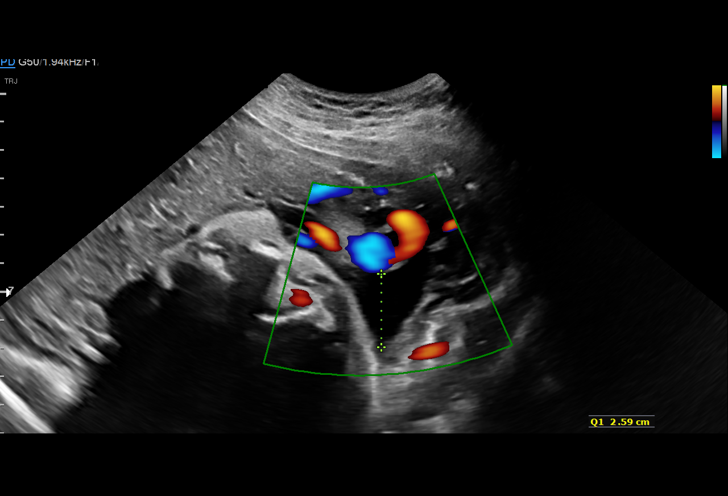
[im 22/29]
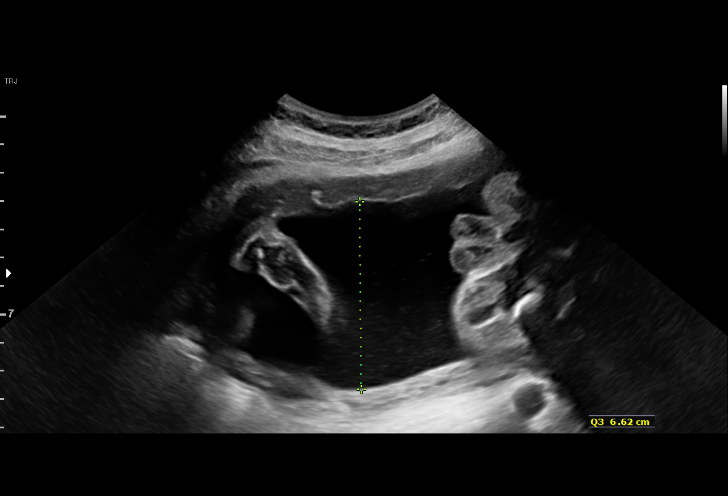
[im 24/29]
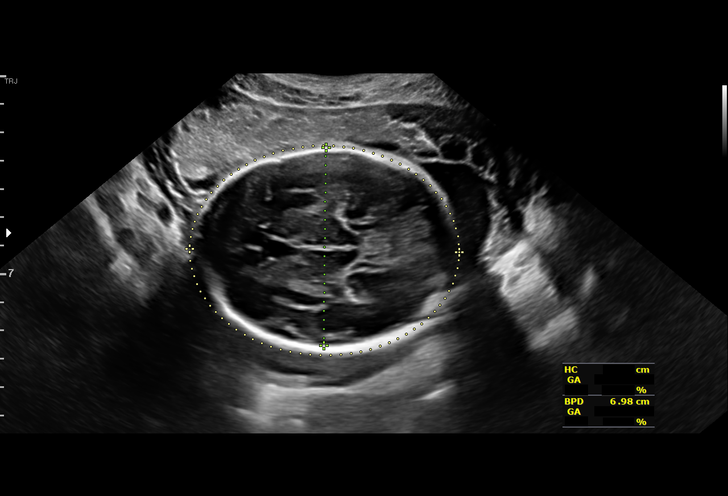
[im 26/29]
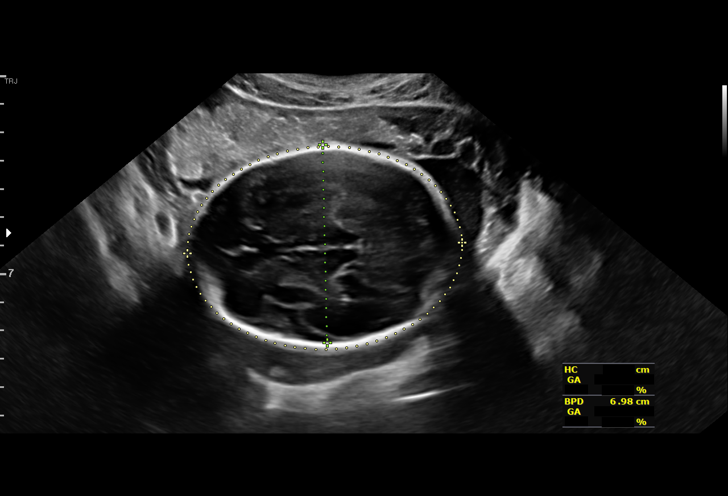
[im 29/29]
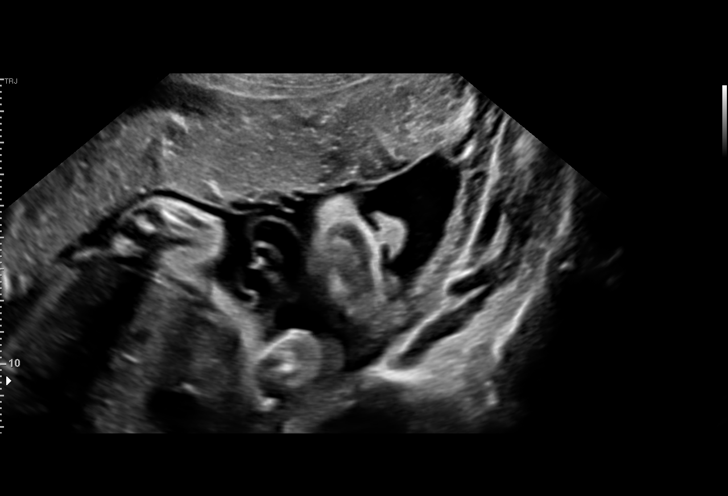

[14 of 28 positions shown; findings below may reference images not displayed]

#130

 ----------------------------------------------------------------------

 ----------------------------------------------------------------------
Indications

  Fetal abnormality - other known or
  suspected (EIF and thickened nuchal fold)
  Seizure disorder
  Fetal karyotype from [facility] is reported
  46,XYY, Normal Fetal Echo @ [facility]
  Encounter for fetal growth retardation
  28 weeks gestation of pregnancy
 ----------------------------------------------------------------------
Fetal Evaluation

 Num Of Fetuses:         1
 Fetal Heart Rate(bpm):  134
 Cardiac Activity:       Observed
 Presentation:           Cephalic
 Placenta:               Anterior
 P. Cord Insertion:      Previously Visualized

 Amniotic Fluid
 AFI FV:      Within normal limits

 AFI Sum(cm)     %Tile       Largest Pocket(cm)
 14.29           47

 RUQ(cm)       RLQ(cm)       LUQ(cm)        LLQ(cm)

Biometry

 BPD:      69.5  mm     G. Age:  27w 6d         31  %    CI:         69.2   %    70 - 86
                                                         FL/HC:      20.1   %    18.8 -
 HC:      266.8  mm     G. Age:  29w 1d         48  %    HC/AC:      1.08        1.05 -
 AC:      246.5  mm     G. Age:  28w 6d         65  %    FL/BPD:     77.1   %    71 - 87
 FL:       53.6  mm     G. Age:  28w 3d         43  %    FL/AC:      21.7   %    20 - 24
 Est. FW:    9047  gm    2 lb 13 oz      62  %
OB History

 Gravidity:    1         Term:   0        Prem:   0        SAB:   0
 TOP:          0       Ectopic:  0        Living: 0
Gestational Age

 LMP:           28w 1d        Date:  08/04/18                 EDD:   05/11/19
 U/S Today:     28w 4d                                        EDD:   05/08/19
 Best:          28w 1d     Det. By:  LMP  (08/04/18)          EDD:   05/11/19
Anatomy

 Cranium:               Appears normal         Aortic Arch:            Previously seen
 Cavum:                 Previously seen        Ductal Arch:            Previously seen
 Ventricles:            Appears normal         Diaphragm:              Previously seen
 Choroid Plexus:        Previously seen        Stomach:                Previously Seen
 Cerebellum:            Previously seen        Abdomen:                Previously seen
 Posterior Fossa:       Previously seen        Abdominal Wall:         Previously seen
 Nuchal Fold:           Previously seen        Cord Vessels:           Previously seen
 Face:                  Previously seen        Kidneys:                Previously seen
 Lips:                  Appears normal         Bladder:                Previously seen
 Heart:                 Appears normal         Spine:                  Previously seen
                        (4CH, axis, and
                        situs)
 RVOT:                  Previously seen        Upper Extremities:      Previously seen
 LVOT:                  Previously seen        Lower Extremities:      Previously seen
Impression

 Patient had amniocentesis on her last visit following abnormal
 NIPT. Fetal karyotype is reported as 46,XYY.
 Fetal growth is appropriate for gestational age. Amniotic fluid
 is normal and good fetal activity is seen.
Recommendations

 An appointment was made for her to return in 4 weeks for
 fetal growth assessment.
                 [place], [place]

## 2020-08-26 ENCOUNTER — Other Ambulatory Visit: Payer: Medicaid Other

## 2021-10-15 ENCOUNTER — Ambulatory Visit
Admission: EM | Admit: 2021-10-15 | Discharge: 2021-10-15 | Disposition: A | Discharge status: Home / Self Care | Payer: Medicaid Other | Attending: Internal Medicine | Admitting: Internal Medicine

## 2021-10-15 ENCOUNTER — Encounter: Payer: Self-pay | Admitting: Emergency Medicine

## 2021-10-15 ENCOUNTER — Other Ambulatory Visit: Payer: Self-pay

## 2021-10-15 DIAGNOSIS — J101 Influenza due to other identified influenza virus with other respiratory manifestations: Secondary | ICD-10-CM

## 2021-10-15 LAB — POCT INFLUENZA A/B
Influenza A, POC: POSITIVE — AB
Influenza B, POC: NEGATIVE

## 2021-10-15 MED ORDER — ACETAMINOPHEN 325 MG PO TABS
650.0000 mg | ORAL_TABLET | Freq: Once | ORAL | Status: AC
  Administered 2021-10-15: 650 mg via ORAL

## 2021-10-15 MED ORDER — OSELTAMIVIR PHOSPHATE 75 MG PO CAPS: 75.0000 mg | ORAL_CAPSULE | Freq: Two times a day (BID) | ORAL | 0 refills | Status: DC

## 2021-10-15 NOTE — ED Triage Notes (Signed)
Patient c/o vomiting, nausea, low back pain x 2 days.  Non-productive cough and fever.  Has taken Thera-flu.  Patient is vaccinated for COVID.

## 2021-10-15 NOTE — Discharge Instructions (Signed)
You have tested positive for the flu.  This is being treated with Tamiflu.  Please continue to monitor fevers and treat as appropriate with Tylenol or ibuprofen.  Follow-up if symptoms persist.

## 2021-10-15 NOTE — ED Provider Notes (Signed)
[facility]    CSN: [id number] Arrival date & time: 10/15/21  1209      History   Chief Complaint Chief Complaint  Patient presents with   Back Pain   Emesis    HPI Kristen Reese is a 25 y.o. female.   Patient presents with nausea, vomiting, low back pain, body aches, nonproductive cough, fever, nasal congestion that started approximately 2 days ago.  She reports that nausea and vomiting has resolved.  Patient not sure of T-max at home.  Has taken TheraFlu with minimal improvement in symptoms.  Denies chest pain, shortness of breath, sore throat, ear pain.  Denies any known sick contacts.  She attributes the nausea and vomiting to alcohol intake the night prior.   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 Authorizing Provider  oseltamivir (TAMIFLU) 75 MG capsule Take 1 capsule (75 mg total) by mouth every 12 (twelve) hours. 10/15/21  Yes Coreen Shippee, Hildred Alamin E, FNP  ibuprofen (ADVIL) 600 MG tablet Take 1 tablet (600 mg total) by mouth every 6 (six) hours as needed for moderate pain. 05/09/19   Marikay Alar, CNM  Prenat w/o A Vit-FeFum-FePo-FA (CONCEPT OB) 130-92.4-1 MG CAPS Take 1 tablet by mouth daily. 11/27/18   Donnamae Jude, MD     Family History Family History  Problem Relation Age of Onset   Hypertension Mother    Diabetes Maternal Grandmother    Heart disease Maternal Grandmother    Hypertension Maternal Grandmother    Hypertension Maternal Grandfather     Social History Social History   Tobacco Use   Smoking status: Never   Smokeless tobacco: Never  Vaping Use   Vaping Use: Never used  Substance Use Topics   Alcohol use: No   Drug use: No     Allergies   Patient has no known allergies.   Review of Systems Review of Systems Per HPI  Physical Exam Triage Vital Signs ED Triage Vitals [10/15/21 1339]  Enc Vitals Group     BP 114/77     Pulse Rate 89     Resp 20     Temp (!) 101 F (38.3 C)     Temp Source Oral     SpO2 96 %     Weight 142 lb (64.4 kg)     Height 5' (1.524 m)     Head Circumference      Peak Flow  Pain Score 7     Pain Loc      Pain Edu?      Excl. in GC?    No data found.  Updated Vital Signs BP 114/77 (BP Location: Left Arm)   Pulse 89   Temp (!) 101 F (38.3 C) (Oral)   Resp 20   Ht 5' (1.524 m)   Wt 142 lb (64.4 kg)   LMP 10/06/2021   SpO2 96%   Breastfeeding No   BMI 27.73 kg/m   Visual Acuity Right Eye Distance:   Left Eye Distance:   Bilateral Distance:    Right Eye Near:   Left Eye Near:    Bilateral Near:     Physical Exam Constitutional:      General: She is not in acute distress.    Appearance: Normal appearance. She is not toxic-appearing or diaphoretic.  HENT:     Head: Normocephalic and atraumatic.     Right Ear: Tympanic membrane and ear canal normal.     Left Ear: Tympanic membrane and ear canal normal.     Nose: Congestion present.     Mouth/Throat:     Mouth: Mucous membranes are moist.     Pharynx: No posterior oropharyngeal erythema.  Eyes:     Extraocular Movements: Extraocular movements intact.     Conjunctiva/sclera: Conjunctivae normal.     Pupils: Pupils are equal, round, and reactive to light.   Cardiovascular:     Rate and Rhythm: Normal rate and regular rhythm.     Pulses: Normal pulses.     Heart sounds: Normal heart sounds.  Pulmonary:     Effort: Pulmonary effort is normal. No respiratory distress.     Breath sounds: Normal breath sounds. No stridor. No wheezing, rhonchi or rales.  Abdominal:     General: Abdomen is flat. Bowel sounds are normal.     Palpations: Abdomen is soft.  Musculoskeletal:        General: Normal range of motion.     Cervical back: Normal range of motion.  Skin:    General: Skin is warm and dry.  Neurological:     General: No focal deficit present.     Mental Status: She is alert and oriented to person, place, and time. Mental status is at baseline.  Psychiatric:        Mood and Affect: Mood normal.        Behavior: Behavior normal.     UC Treatments / Results  Labs (all labs ordered are listed, but only abnormal results are displayed) Labs Reviewed  POCT INFLUENZA A/B - Abnormal; Notable for the following components:      Result Value   Influenza A, POC Positive (*)    All other components within normal limits    EKG   Radiology No results found.  Procedures Procedures (including critical care time)  Medications Ordered in UC Medications  acetaminophen (TYLENOL) tablet 650 mg (650 mg Oral Given 10/15/21 1343)    Initial Impression / Assessment and Plan / UC Course  I have reviewed the triage vital signs and the nursing notes.  Pertinent labs & imaging results that were available during my care of the patient were reviewed by me and considered in my medical decision making (see chart for details).     Patient tested positive for influenza A.  Will treat with Tamiflu x5 days.  Acetaminophen administered in urgent care today.  Fever monitoring and management discussed with patient.  Discussed supportive  care and symptom management with patient.  Discussed strict return precautions.  Patient verbalized understanding and was  agreeable with plan. Final Clinical Impressions(s) / UC Diagnoses   Final diagnoses:  Influenza A     Discharge Instructions      You have tested positive for the flu.  This is being treated with Tamiflu.  Please continue to monitor fevers and treat as appropriate with Tylenol or ibuprofen.  Follow-up if symptoms persist.    ED Prescriptions     Medication Sig Dispense Auth. Provider   oseltamivir (TAMIFLU) 75 MG capsule Take 1 capsule (75 mg total) by mouth every 12 (twelve) hours. 10 capsule Teodora Medici, [place]      PDMP not reviewed this encounter.   Teodora Medici, [place] 10/15/21 1401

## 2023-08-25 DIAGNOSIS — N912 Amenorrhea, unspecified: Secondary | ICD-10-CM | POA: Diagnosis not present

## 2023-09-09 DIAGNOSIS — Z3200 Encounter for pregnancy test, result unknown: Secondary | ICD-10-CM | POA: Diagnosis not present

## 2023-09-14 ENCOUNTER — Other Ambulatory Visit: Payer: Self-pay

## 2023-09-14 ENCOUNTER — Inpatient Hospital Stay (HOSPITAL_COMMUNITY)
Admission: AD | Admit: 2023-09-14 | Discharge: 2023-09-14 | Disposition: A | Discharge status: Home / Self Care | Payer: BC Managed Care – PPO | Attending: Obstetrics and Gynecology | Admitting: Obstetrics and Gynecology

## 2023-09-14 ENCOUNTER — Encounter (HOSPITAL_COMMUNITY): Payer: Self-pay

## 2023-09-14 DIAGNOSIS — O98511 Other viral diseases complicating pregnancy, first trimester: Secondary | ICD-10-CM | POA: Diagnosis not present

## 2023-09-14 DIAGNOSIS — G43909 Migraine, unspecified, not intractable, without status migrainosus: Secondary | ICD-10-CM | POA: Insufficient documentation

## 2023-09-14 DIAGNOSIS — Z331 Pregnant state, incidental: Secondary | ICD-10-CM

## 2023-09-14 DIAGNOSIS — Z3481 Encounter for supervision of other normal pregnancy, first trimester: Secondary | ICD-10-CM | POA: Diagnosis not present

## 2023-09-14 DIAGNOSIS — Z3A08 8 weeks gestation of pregnancy: Secondary | ICD-10-CM | POA: Diagnosis not present

## 2023-09-14 DIAGNOSIS — G43109 Migraine with aura, not intractable, without status migrainosus: Secondary | ICD-10-CM | POA: Diagnosis not present

## 2023-09-14 DIAGNOSIS — G43009 Migraine without aura, not intractable, without status migrainosus: Secondary | ICD-10-CM

## 2023-09-14 DIAGNOSIS — O99351 Diseases of the nervous system complicating pregnancy, first trimester: Secondary | ICD-10-CM | POA: Diagnosis not present

## 2023-09-14 DIAGNOSIS — H53141 Visual discomfort, right eye: Secondary | ICD-10-CM | POA: Diagnosis not present

## 2023-09-14 LAB — URINALYSIS, ROUTINE W REFLEX MICROSCOPIC
Bilirubin Urine: NEGATIVE
Glucose, UA: NEGATIVE mg/dL
Hgb urine dipstick: NEGATIVE
Ketones, ur: NEGATIVE mg/dL
Leukocytes,Ua: NEGATIVE
Nitrite: NEGATIVE
Protein, ur: NEGATIVE mg/dL
Specific Gravity, Urine: 1.008 (ref 1.005–1.030)
pH: 7 (ref 5.0–8.0)

## 2023-09-14 MED ORDER — BUTALBITAL-APAP-CAFFEINE 50-325-40 MG PO TABS: 1.0000 | ORAL_TABLET | Freq: Four times a day (QID) | ORAL | 0 refills | Status: DC | PRN

## 2023-09-14 MED ORDER — ACETAMINOPHEN-CAFFEINE 500-65 MG PO TABS: 2.0000 | ORAL_TABLET | Freq: Once | ORAL | Status: DC

## 2023-09-14 MED ORDER — ASPIRIN-ACETAMINOPHEN-CAFFEINE 250-250-65 MG PO TABS: 2.0000 | ORAL_TABLET | Freq: Once | ORAL | Status: DC

## 2023-09-14 MED ORDER — BUTALBITAL-APAP-CAFFEINE 50-325-40 MG PO TABS
2.0000 | ORAL_TABLET | Freq: Once | ORAL | Status: AC
  Administered 2023-09-14: 2 via ORAL
  Filled 2023-09-14: qty 2

## 2023-09-14 NOTE — MAU Provider Note (Signed)
History     Chief Complaint  Patient presents with   Headache   Blurred Vision   27 yo G2P1 BF @ [redacted]w[redacted]d presents with c/o left sided head pressure with blurry vision which started this am. Pt notes light sensitivity. Denies n/v. Denies migraine h/a. Also denies any numbness of arms Pt admits to not slept well last night. Ate at 8 am only OB History     Gravida  2   Para  1   Term  1   Preterm  0   AB  0   Living  1      SAB  0   IAB  0   Ectopic  0   Multiple  0   Live Births  1           Past Medical History:  Diagnosis Date   HSV (herpes simplex virus) anogenital infection    Migraine    Seizures ([facility])     Past Surgical History:  Procedure Laterality Date   CESAREAN SECTION N/A 05/07/2019   Procedure: CESAREAN SECTION;  Surgeon: Essie Hart, MD;  Location: [facility] LD [facility]S;  Service: Obstetrics;  Laterality: N/A;   WISDOM TOOTH EXTRACTION      Family History  Problem Relation Age of Onset   Hypertension Mother    Diabetes Maternal Grandmother    Heart disease Maternal Grandmother    Hypertension Maternal Grandmother    Hypertension Maternal Grandfather     Social History   Tobacco Use   Smoking status: Never   Smokeless tobacco: Never  Vaping Use   Vaping status: Never Used  Substance Use Topics   Alcohol use: No   Drug use: No    Allergies: No Known Allergies  Medications Prior to Admission  Medication Sig Dispense Refill Last Dose   ibuprofen (ADVIL) 600 MG tablet Take 1 tablet (600 mg total) by mouth every 6 (six) hours as needed for moderate pain. 30 tablet 0 Unknown   oseltamivir (TAMIFLU) 75 MG capsule Take 1 capsule (75 mg total) by mouth every 12 (twelve) hours. 10 capsule 0 Unknown   Prenat w/o A Vit-FeFum-FePo-FA (CONCEPT OB) 130-92.4-1 MG CAPS Take 1 tablet by mouth daily. 90 capsule 3 Unknown     Physical Exam   Blood pressure 123/80, pulse 75, temperature 97.9 F (36.6 C), temperature source Oral, resp. rate 16, height 5'  1" (1.549 m), last menstrual period 07/18/2023, SpO2 100%.  General appearance: alert, cooperative, and no distress Eyes: negative findings: pupils equal, round, reactive to light and accomodation Abd soft. Bedside sono (+) FHR  IMP: Migraine IUP@ 8w 2d P) Fioricet 2 tabs given. D/c home. Encourage oral hydration and frequent small protein meals ED Course   MDM  Serita Kyle, MD 2:23 PM 09/14/2023

## 2023-09-14 NOTE — Discharge Instructions (Signed)
Increase oral fluid intake. Frequent small protein intake

## 2023-09-14 NOTE — MAU Note (Signed)
.  Kristen Reese is a 27 y.o. at [redacted]w[redacted]d here in MAU reporting: headache and pressure on left side of head that started this morning and seeing spots out of left eye.  Denies vag bleeding or abd pain.  Pt reports she has not taken any pain meds today.   Onset of complaint:  Pain score: 7 Vitals:   09/14/23 1306  BP: 120/76  Pulse: 95  Resp: 16  Temp: 97.9 F (36.6 C)  SpO2: 100%      Lab orders placed from triage:   ua

## 2023-09-18 LAB — HEPATITIS C ANTIBODY: HCV Ab: NEGATIVE

## 2023-09-18 LAB — OB RESULTS CONSOLE HEPATITIS B SURFACE ANTIGEN: Hepatitis B Surface Ag: NEGATIVE

## 2023-09-18 LAB — OB RESULTS CONSOLE GC/CHLAMYDIA
Chlamydia: NEGATIVE
Neisseria Gonorrhea: NEGATIVE

## 2023-09-18 LAB — OB RESULTS CONSOLE HIV ANTIBODY (ROUTINE TESTING): HIV: NONREACTIVE

## 2023-09-18 LAB — OB RESULTS CONSOLE RUBELLA ANTIBODY, IGM: Rubella: IMMUNE

## 2023-09-19 DIAGNOSIS — Z3481 Encounter for supervision of other normal pregnancy, first trimester: Secondary | ICD-10-CM | POA: Diagnosis not present

## 2023-09-29 DIAGNOSIS — Z3481 Encounter for supervision of other normal pregnancy, first trimester: Secondary | ICD-10-CM | POA: Diagnosis not present

## 2023-09-29 DIAGNOSIS — B009 Herpesviral infection, unspecified: Secondary | ICD-10-CM | POA: Diagnosis not present

## 2023-09-29 DIAGNOSIS — O34219 Maternal care for unspecified type scar from previous cesarean delivery: Secondary | ICD-10-CM | POA: Diagnosis not present

## 2023-09-29 DIAGNOSIS — Z113 Encounter for screening for infections with a predominantly sexual mode of transmission: Secondary | ICD-10-CM | POA: Diagnosis not present

## 2023-10-21 DIAGNOSIS — O26899 Other specified pregnancy related conditions, unspecified trimester: Secondary | ICD-10-CM | POA: Diagnosis not present

## 2023-11-05 NOTE — L&D Delivery Note (Addendum)
 Operative Delivery Note At 10:36 PM a viable and healthy female was delivered via VBAC, Vacuum Assisted.  Presentation: vertex; compound presentation right hand Position: Right,, Occiput,, Anterior; Station: +2.  Verbal consent: obtained from patient.  Risks and benefits discussed in detail.  Risks include, but are not limited to the risks of anesthesia, bleeding, infection, damage to maternal tissues, fetal cephalhematoma.  There is also the risk of inability to effect vaginal delivery of the head, or shoulder dystocia that cannot be resolved by established maneuvers, leading to the need for emergency cesarean section. Indication: terminal bradycardia APGAR: 4, 9; weight  pending.   Placenta status:  spontaneous intact not sent, .   Cord:  CAN x 3 with the following complications: .  Cord pH: none Delivery of body accomplished after delivering of right arm Anesthesia:  epidural Instruments: mushroom Episiotomy: None Lacerations:  right anterior Vaginal; bilateral Labial minora( upper) Suture Repair: 3.0 chromic Est. Blood Loss (mL):  310  Mom to postpartum.  Baby to Couplet care / Skin to Skin.  Townsend Cudworth A Ausha Sieh 04/29/2024, 11:11 PM

## 2023-11-17 DIAGNOSIS — Z361 Encounter for antenatal screening for raised alphafetoprotein level: Secondary | ICD-10-CM | POA: Diagnosis not present

## 2023-11-17 DIAGNOSIS — Z3482 Encounter for supervision of other normal pregnancy, second trimester: Secondary | ICD-10-CM | POA: Diagnosis not present

## 2023-12-09 DIAGNOSIS — Z3482 Encounter for supervision of other normal pregnancy, second trimester: Secondary | ICD-10-CM | POA: Diagnosis not present

## 2023-12-09 DIAGNOSIS — Z3689 Encounter for other specified antenatal screening: Secondary | ICD-10-CM | POA: Diagnosis not present

## 2024-01-06 DIAGNOSIS — Z3482 Encounter for supervision of other normal pregnancy, second trimester: Secondary | ICD-10-CM | POA: Diagnosis not present

## 2024-01-27 DIAGNOSIS — Z362 Encounter for other antenatal screening follow-up: Secondary | ICD-10-CM | POA: Diagnosis not present

## 2024-01-27 DIAGNOSIS — Z3689 Encounter for other specified antenatal screening: Secondary | ICD-10-CM | POA: Diagnosis not present

## 2024-01-27 DIAGNOSIS — Z3483 Encounter for supervision of other normal pregnancy, third trimester: Secondary | ICD-10-CM | POA: Diagnosis not present

## 2024-02-11 DIAGNOSIS — Z3483 Encounter for supervision of other normal pregnancy, third trimester: Secondary | ICD-10-CM | POA: Diagnosis not present

## 2024-02-11 DIAGNOSIS — Z3689 Encounter for other specified antenatal screening: Secondary | ICD-10-CM | POA: Diagnosis not present

## 2024-02-11 LAB — OB RESULTS CONSOLE RPR: RPR: NONREACTIVE

## 2024-02-26 DIAGNOSIS — Z3483 Encounter for supervision of other normal pregnancy, third trimester: Secondary | ICD-10-CM | POA: Diagnosis not present

## 2024-03-11 DIAGNOSIS — Z3483 Encounter for supervision of other normal pregnancy, third trimester: Secondary | ICD-10-CM | POA: Diagnosis not present

## 2024-03-18 DIAGNOSIS — N898 Other specified noninflammatory disorders of vagina: Secondary | ICD-10-CM | POA: Diagnosis not present

## 2024-03-18 DIAGNOSIS — O99891 Other specified diseases and conditions complicating pregnancy: Secondary | ICD-10-CM | POA: Diagnosis not present

## 2024-03-18 DIAGNOSIS — O26899 Other specified pregnancy related conditions, unspecified trimester: Secondary | ICD-10-CM | POA: Diagnosis not present

## 2024-03-24 DIAGNOSIS — Z3483 Encounter for supervision of other normal pregnancy, third trimester: Secondary | ICD-10-CM | POA: Diagnosis not present

## 2024-03-24 DIAGNOSIS — O26849 Uterine size-date discrepancy, unspecified trimester: Secondary | ICD-10-CM | POA: Diagnosis not present

## 2024-03-24 DIAGNOSIS — Z3685 Encounter for antenatal screening for Streptococcus B: Secondary | ICD-10-CM | POA: Diagnosis not present

## 2024-04-07 DIAGNOSIS — O26849 Uterine size-date discrepancy, unspecified trimester: Secondary | ICD-10-CM | POA: Diagnosis not present

## 2024-04-15 DIAGNOSIS — O26849 Uterine size-date discrepancy, unspecified trimester: Secondary | ICD-10-CM | POA: Diagnosis not present

## 2024-04-21 DIAGNOSIS — O26849 Uterine size-date discrepancy, unspecified trimester: Secondary | ICD-10-CM | POA: Diagnosis not present

## 2024-04-22 ENCOUNTER — Telehealth (HOSPITAL_COMMUNITY): Payer: Self-pay | Admitting: *Deleted

## 2024-04-22 NOTE — Telephone Encounter (Signed)
 Preadmission screen

## 2024-04-26 ENCOUNTER — Encounter (HOSPITAL_COMMUNITY): Payer: Self-pay | Admitting: *Deleted

## 2024-04-26 ENCOUNTER — Telehealth (HOSPITAL_COMMUNITY): Payer: Self-pay | Admitting: *Deleted

## 2024-04-26 ENCOUNTER — Inpatient Hospital Stay (HOSPITAL_COMMUNITY)
Admission: AD | Admit: 2024-04-26 | Discharge: 2024-04-27 | Disposition: A | Discharge status: Home / Self Care | Source: Home / Self Care | Attending: Obstetrics and Gynecology | Admitting: Obstetrics and Gynecology

## 2024-04-26 ENCOUNTER — Encounter (HOSPITAL_COMMUNITY): Payer: Self-pay | Admitting: Obstetrics and Gynecology

## 2024-04-26 DIAGNOSIS — O326XX0 Maternal care for compound presentation, not applicable or unspecified: Secondary | ICD-10-CM | POA: Diagnosis not present

## 2024-04-26 DIAGNOSIS — A6000 Herpesviral infection of urogenital system, unspecified: Secondary | ICD-10-CM | POA: Diagnosis not present

## 2024-04-26 DIAGNOSIS — Z833 Family history of diabetes mellitus: Secondary | ICD-10-CM | POA: Diagnosis not present

## 2024-04-26 DIAGNOSIS — O134 Gestational [pregnancy-induced] hypertension without significant proteinuria, complicating childbirth: Secondary | ICD-10-CM | POA: Diagnosis not present

## 2024-04-26 DIAGNOSIS — O368330 Maternal care for abnormalities of the fetal heart rate or rhythm, third trimester, not applicable or unspecified: Secondary | ICD-10-CM | POA: Diagnosis not present

## 2024-04-26 DIAGNOSIS — O34211 Maternal care for low transverse scar from previous cesarean delivery: Secondary | ICD-10-CM | POA: Diagnosis not present

## 2024-04-26 DIAGNOSIS — Z3A40 40 weeks gestation of pregnancy: Secondary | ICD-10-CM | POA: Diagnosis not present

## 2024-04-26 DIAGNOSIS — O26849 Uterine size-date discrepancy, unspecified trimester: Secondary | ICD-10-CM | POA: Diagnosis not present

## 2024-04-26 DIAGNOSIS — Z8249 Family history of ischemic heart disease and other diseases of the circulatory system: Secondary | ICD-10-CM | POA: Diagnosis not present

## 2024-04-26 DIAGNOSIS — O471 False labor at or after 37 completed weeks of gestation: Secondary | ICD-10-CM | POA: Insufficient documentation

## 2024-04-26 DIAGNOSIS — O9832 Other infections with a predominantly sexual mode of transmission complicating childbirth: Secondary | ICD-10-CM | POA: Diagnosis not present

## 2024-04-26 DIAGNOSIS — O480 Post-term pregnancy: Secondary | ICD-10-CM | POA: Diagnosis not present

## 2024-04-26 DIAGNOSIS — O26893 Other specified pregnancy related conditions, third trimester: Secondary | ICD-10-CM | POA: Diagnosis not present

## 2024-04-26 LAB — RUPTURE OF MEMBRANE (ROM)PLUS: Rom Plus: NEGATIVE

## 2024-04-26 LAB — POCT FERN TEST: POCT Fern Test: NEGATIVE

## 2024-04-26 NOTE — Telephone Encounter (Signed)
 Preadmission screen

## 2024-04-26 NOTE — MAU Note (Signed)
..  Kristen Reese is a 28 y.o. at [redacted]w[redacted]d here in MAU reporting: contractions which have been ongoing for the last couple of days but which have gotten more intense in the last hour and a half. Currently reports ctx q2 min at 6/10 pain.  Reports some leaking fluid after urination that started yesterday 1500.  Endorses FM.  Denies vaginal bleeding  Hx of cesarean section x1 for fetal heart rate.  Plans TOLAC   Pain score: 6/10 Vitals:   04/26/24 2226  BP: 115/77  Pulse: 92  Resp: 16  Temp: 98.5 F (36.9 C)  SpO2: 100%     FHT:140 Lab orders placed from triage:  labor eval

## 2024-04-27 NOTE — Discharge Instructions (Signed)
 Labor precautions

## 2024-04-28 DIAGNOSIS — O26849 Uterine size-date discrepancy, unspecified trimester: Secondary | ICD-10-CM | POA: Diagnosis not present

## 2024-04-29 ENCOUNTER — Inpatient Hospital Stay (HOSPITAL_COMMUNITY)
Admission: AD | Admit: 2024-04-29 | Discharge: 2024-05-01 | DRG: 806 | Disposition: A | Discharge status: Home / Self Care | Attending: Obstetrics and Gynecology | Admitting: Obstetrics and Gynecology

## 2024-04-29 ENCOUNTER — Encounter (HOSPITAL_COMMUNITY): Payer: Self-pay | Admitting: Obstetrics and Gynecology

## 2024-04-29 ENCOUNTER — Other Ambulatory Visit: Payer: Self-pay

## 2024-04-29 ENCOUNTER — Inpatient Hospital Stay (HOSPITAL_COMMUNITY): Admitting: Anesthesiology

## 2024-04-29 DIAGNOSIS — Z8249 Family history of ischemic heart disease and other diseases of the circulatory system: Secondary | ICD-10-CM

## 2024-04-29 DIAGNOSIS — O480 Post-term pregnancy: Secondary | ICD-10-CM | POA: Diagnosis present

## 2024-04-29 DIAGNOSIS — A6000 Herpesviral infection of urogenital system, unspecified: Secondary | ICD-10-CM | POA: Diagnosis present

## 2024-04-29 DIAGNOSIS — O26893 Other specified pregnancy related conditions, third trimester: Secondary | ICD-10-CM | POA: Diagnosis present

## 2024-04-29 DIAGNOSIS — O368330 Maternal care for abnormalities of the fetal heart rate or rhythm, third trimester, not applicable or unspecified: Secondary | ICD-10-CM | POA: Diagnosis not present

## 2024-04-29 DIAGNOSIS — Z3A40 40 weeks gestation of pregnancy: Secondary | ICD-10-CM | POA: Diagnosis not present

## 2024-04-29 DIAGNOSIS — O134 Gestational [pregnancy-induced] hypertension without significant proteinuria, complicating childbirth: Secondary | ICD-10-CM | POA: Diagnosis present

## 2024-04-29 DIAGNOSIS — O9832 Other infections with a predominantly sexual mode of transmission complicating childbirth: Secondary | ICD-10-CM | POA: Diagnosis present

## 2024-04-29 DIAGNOSIS — O34219 Maternal care for unspecified type scar from previous cesarean delivery: Principal | ICD-10-CM | POA: Diagnosis present

## 2024-04-29 DIAGNOSIS — O326XX0 Maternal care for compound presentation, not applicable or unspecified: Secondary | ICD-10-CM | POA: Diagnosis present

## 2024-04-29 DIAGNOSIS — Z833 Family history of diabetes mellitus: Secondary | ICD-10-CM | POA: Diagnosis not present

## 2024-04-29 DIAGNOSIS — O34211 Maternal care for low transverse scar from previous cesarean delivery: Secondary | ICD-10-CM | POA: Diagnosis present

## 2024-04-29 LAB — CBC
HCT: 35.2 % — ABNORMAL LOW (ref 36.0–46.0)
Hemoglobin: 11.5 g/dL — ABNORMAL LOW (ref 12.0–15.0)
MCH: 29.3 pg (ref 26.0–34.0)
MCHC: 32.7 g/dL (ref 30.0–36.0)
MCV: 89.6 fL (ref 80.0–100.0)
Platelets: 244 10*3/uL (ref 150–400)
RBC: 3.93 MIL/uL (ref 3.87–5.11)
RDW: 14.2 % (ref 11.5–15.5)
WBC: 10.4 10*3/uL (ref 4.0–10.5)
nRBC: 0 % (ref 0.0–0.2)

## 2024-04-29 LAB — COMPREHENSIVE METABOLIC PANEL WITH GFR
ALT: 15 U/L (ref 0–44)
AST: 25 U/L (ref 15–41)
Albumin: 2.9 g/dL — ABNORMAL LOW (ref 3.5–5.0)
Alkaline Phosphatase: 292 U/L — ABNORMAL HIGH (ref 38–126)
Anion gap: 9 (ref 5–15)
BUN: <5 mg/dL — ABNORMAL LOW (ref 6–20)
CO2: 20 mmol/L — ABNORMAL LOW (ref 22–32)
Calcium: 9.2 mg/dL (ref 8.9–10.3)
Chloride: 106 mmol/L (ref 98–111)
Creatinine, Ser: 0.57 mg/dL (ref 0.44–1.00)
GFR, Estimated: >60 mL/min (ref >60)
Glucose, Bld: 94 mg/dL (ref 70–99)
Potassium: 3.6 mmol/L (ref 3.5–5.1)
Sodium: 135 mmol/L (ref 135–145)
Total Bilirubin: 0.9 mg/dL (ref 0.0–1.2)
Total Protein: 6.6 g/dL (ref 6.5–8.1)

## 2024-04-29 LAB — PROTEIN / CREATININE RATIO, URINE
Creatinine, Urine: 90 mg/dL
Protein Creatinine Ratio: 0.09 mg/mg{creat} (ref 0.00–0.15)
Total Protein, Urine: 8 mg/dL

## 2024-04-29 LAB — TYPE AND SCREEN: ABO/RH(D): O POS

## 2024-04-29 LAB — RPR: RPR Ser Ql: NONREACTIVE

## 2024-04-29 MED ORDER — LACTATED RINGERS IV SOLN: 500.0000 mL | INTRAVENOUS | Status: DC | PRN

## 2024-04-29 MED ORDER — TERBUTALINE SULFATE 1 MG/ML IJ SOLN
0.2500 mg | Freq: Once | INTRAMUSCULAR | Status: DC | PRN
Start: 2024-04-29 — End: 2024-04-30

## 2024-04-29 MED ORDER — SOD CITRATE-CITRIC ACID 500-334 MG/5ML PO SOLN: 30.0000 mL | ORAL | Status: DC | PRN

## 2024-04-29 MED ORDER — OXYCODONE-ACETAMINOPHEN 5-325 MG PO TABS: 1.0000 | ORAL_TABLET | ORAL | Status: DC | PRN

## 2024-04-29 MED ORDER — PHENYLEPHRINE 80 MCG/ML (10ML) SYRINGE FOR IV PUSH (FOR BLOOD PRESSURE SUPPORT): 80.0000 ug | PREFILLED_SYRINGE | INTRAVENOUS | Status: DC | PRN

## 2024-04-29 MED ORDER — PHENYLEPHRINE 80 MCG/ML (10ML) SYRINGE FOR IV PUSH (FOR BLOOD PRESSURE SUPPORT)
80.0000 ug | PREFILLED_SYRINGE | INTRAVENOUS | Status: DC | PRN
  Filled 2024-04-29: qty 10

## 2024-04-29 MED ORDER — LIDOCAINE HCL (PF) 1 % IJ SOLN: 30.0000 mL | INTRAMUSCULAR | Status: DC | PRN

## 2024-04-29 MED ORDER — OXYTOCIN BOLUS FROM INFUSION
333.0000 mL | Freq: Once | INTRAVENOUS | Status: AC
Start: 2024-04-29 — End: 2024-04-29
  Administered 2024-04-29: 333 mL via INTRAVENOUS

## 2024-04-29 MED ORDER — OXYCODONE-ACETAMINOPHEN 5-325 MG PO TABS: 2.0000 | ORAL_TABLET | ORAL | Status: DC | PRN

## 2024-04-29 MED ORDER — DIPHENHYDRAMINE HCL 50 MG/ML IJ SOLN: 12.5000 mg | INTRAMUSCULAR | Status: DC | PRN

## 2024-04-29 MED ORDER — ONDANSETRON HCL 4 MG/2ML IJ SOLN
4.0000 mg | Freq: Four times a day (QID) | INTRAMUSCULAR | Status: DC | PRN
  Administered 2024-04-29: 4 mg via INTRAVENOUS
  Filled 2024-04-29: qty 2

## 2024-04-29 MED ORDER — LACTATED RINGERS IV SOLN: INTRAVENOUS | Status: DC

## 2024-04-29 MED ORDER — OXYTOCIN-SODIUM CHLORIDE 30-0.9 UT/500ML-% IV SOLN
2.5000 [IU]/h | INTRAVENOUS | Status: DC
  Filled 2024-04-29: qty 500

## 2024-04-29 MED ORDER — LIDOCAINE HCL (PF) 1 % IJ SOLN
INTRAMUSCULAR | Status: DC | PRN
  Administered 2024-04-29: 10 mL via EPIDURAL

## 2024-04-29 MED ORDER — FENTANYL-BUPIVACAINE-NACL 0.5-0.125-0.9 MG/250ML-% EP SOLN
12.0000 mL/h | EPIDURAL | Status: DC | PRN
  Administered 2024-04-29: 12 mL/h via EPIDURAL
  Filled 2024-04-29: qty 250

## 2024-04-29 MED ORDER — ACETAMINOPHEN 325 MG PO TABS: 650.0000 mg | ORAL_TABLET | ORAL | Status: DC | PRN

## 2024-04-29 MED ORDER — EPHEDRINE 5 MG/ML INJ
10.0000 mg | INTRAVENOUS | Status: DC | PRN
Start: 2024-04-29 — End: 2024-04-30

## 2024-04-29 MED ORDER — OXYTOCIN-SODIUM CHLORIDE 30-0.9 UT/500ML-% IV SOLN
1.0000 m[IU]/min | INTRAVENOUS | Status: DC
  Administered 2024-04-29: 2 m[IU]/min via INTRAVENOUS

## 2024-04-29 MED ORDER — LACTATED RINGERS IV SOLN: 500.0000 mL | Freq: Once | INTRAVENOUS | Status: DC

## 2024-04-29 MED ORDER — OXYTOCIN 10 UNIT/ML IJ SOLN
10.0000 [IU] | Freq: Once | INTRAMUSCULAR | Status: AC
  Administered 2024-04-29: 10 [IU] via INTRAMUSCULAR
  Filled 2024-04-29: qty 1

## 2024-04-29 MED ORDER — EPHEDRINE 5 MG/ML INJ: 10.0000 mg | INTRAVENOUS | Status: DC | PRN

## 2024-04-29 NOTE — H&P (Signed)
 Kristen Reese is a 28 y.o. female presenting with SROM 7:40 am clear fluid. (+) ctx. (+)FM. GBS cx neg. Previous LTCS for NRFT desires VBAC. OB History     Gravida  2   Para  1   Term  1   Preterm  0   AB  0   Living  1      SAB  0   IAB  0   Ectopic  0   Multiple  0   Live Births  1          Past Medical History:  Diagnosis Date   HSV (herpes simplex virus) anogenital infection    Migraine    Seizures ([facility])    Past Surgical History:  Procedure Laterality Date   CESAREAN SECTION N/A 05/07/2019   Procedure: CESAREAN SECTION;  Surgeon: Kristen Muskrat, MD;  Location: [facility]S;  Service: Obstetrics;  Laterality: N/A;   WISDOM TOOTH EXTRACTION     Family History: family history includes Diabetes in her maternal grandmother; Heart disease in her maternal grandmother; Hypertension in her maternal grandfather, maternal grandmother, and mother. Social History:  reports that she has never smoked. She has never used smokeless tobacco. She reports that she does not drink alcohol and does not use drugs.     Maternal Diabetes: No Genetic Screening: Normal Maternal Ultrasounds/Referrals: Fetal renal pyelectasis resolved Fetal Ultrasounds or other Referrals:  None Maternal Substance Abuse:  No Significant Maternal Medications:  Meds include: Other:  valtrex Significant Maternal Lab Results:  Group B Strep negative Number of Prenatal Visits:greater than 3 verified prenatal visits Maternal Vaccinations:TDap Other Comments:  previous LTCS  Review of Systems  All other systems reviewed and are negative.  History Dilation: 3 Effacement (%): 80 Station: -2 Exam by:: Dr. Rutherford Blood pressure 128/88, pulse 76, temperature 98.3 F (36.8 C), temperature source Oral, resp. rate 18, last menstrual period 07/18/2023, SpO2 100%. Exam Physical Exam Constitutional:      Appearance: Normal appearance.  HENT:     Head: Atraumatic.   Eyes:     Extraocular Movements:  Extraocular movements intact.    Cardiovascular:     Rate and Rhythm: Regular rhythm.     Heart sounds: Normal heart sounds.  Pulmonary:     Effort: Pulmonary effort is normal.  Genitourinary:    Comments: 3/80/-2 clear fluid  Musculoskeletal:     Cervical back: Neck supple.   Skin:    General: Skin is warm and dry.   Neurological:     General: No focal deficit present.     Mental Status: She is alert and oriented to person, place, and time.   Psychiatric:        Mood and Affect: Mood normal.        Behavior: Behavior normal.        Prenatal labs: ABO, Rh:   Antibody:   Rubella: Immune (11/14 0000) RPR: Nonreactive (04/09 0000)  HBsAg: Negative (11/14 0000)  HIV: Non-reactive (11/14 0000)  GBS:   negative  Assessment/Plan: Latent phase Postdates Previous LTCS desires VBAC P) admit routine labs. Epidural. Pitocin  augmentation   Kristen Reese A Kristen Reese 04/29/2024, 8:19 AM

## 2024-04-29 NOTE — Anesthesia Procedure Notes (Signed)
 Epidural Patient location during procedure: OB Start time: 04/29/2024 5:05 PM End time: 04/29/2024 5:15 PM  Staffing Anesthesiologist: Niels Marien CROME, MD Performed: anesthesiologist   Preanesthetic Checklist Completed: patient identified, IV checked, risks and benefits discussed, monitors and equipment checked, pre-op evaluation and timeout performed  Epidural Patient position: sitting Prep: DuraPrep and site prepped and draped Patient monitoring: continuous pulse ox, blood pressure, heart rate and cardiac monitor Approach: midline Location: L3-L4 Injection technique: LOR air  Needle:  Needle type: Tuohy  Needle gauge: 17 G Needle length: 9 cm Needle insertion depth: 6 cm Catheter type: closed end flexible Catheter size: 19 Gauge Catheter at skin depth: 11 cm Test dose: negative  Assessment Sensory level: T8 Events: blood not aspirated, no cerebrospinal fluid, injection not painful, no injection resistance, no paresthesia and negative IV test  Additional Notes Patient identified. Risks/Benefits/Options discussed with patient including but not limited to bleeding, infection, nerve damage, paralysis, failed block, incomplete pain control, headache, blood pressure changes, nausea, vomiting, reactions to medication both or allergic, itching and postpartum back pain. Confirmed with bedside nurse the patient's most recent platelet count. Confirmed with patient that they are not currently taking any anticoagulation, have any bleeding history or any family history of bleeding disorders. Patient expressed understanding and wished to proceed. All questions were answered. Sterile technique was used throughout the entire procedure. Please see nursing notes for vital signs. Test dose was given through epidural catheter and negative prior to continuing to dose epidural or start infusion. Warning signs of high block given to the patient including shortness of breath, tingling/numbness in hands,  complete motor block, or any concerning symptoms with instructions to call for help. Patient was given instructions on fall risk and not to get out of bed. All questions and concerns addressed with instructions to call with any issues or inadequate analgesia.  Reason for block:procedure for pain

## 2024-04-29 NOTE — Anesthesia Preprocedure Evaluation (Signed)
 Anesthesia Evaluation  Patient identified by MRN, date of birth, ID band Patient awake    Reviewed: Allergy & Precautions, NPO status , Patient's Chart, lab work & pertinent test results  Airway Mallampati: II  TM Distance: >3 FB Neck ROM: Full    Dental no notable dental hx.    Pulmonary neg pulmonary ROS   Pulmonary exam normal breath sounds clear to auscultation       Cardiovascular negative cardio ROS Normal cardiovascular exam Rhythm:Regular Rate:Normal     Neuro/Psych  Headaches, Seizures -, Well Controlled,   negative psych ROS   GI/Hepatic negative GI ROS, Neg liver ROS,,,  Endo/Other  negative endocrine ROS    Renal/GU negative Renal ROS  negative genitourinary   Musculoskeletal negative musculoskeletal ROS (+)    Abdominal   Peds  Hematology negative hematology ROS (+)   Anesthesia Other Findings Presents with SROM. Pregnancy c/b TOLAC  Reproductive/Obstetrics (+) Pregnancy                             Anesthesia Physical Anesthesia Plan  ASA: 2  Anesthesia Plan: Epidural   Post-op Pain Management:    Induction:   PONV Risk Score and Plan: Treatment may vary due to age or medical condition  Airway Management Planned: Natural Airway  Additional Equipment:   Intra-op Plan:   Post-operative Plan:   Informed Consent: I have reviewed the patients History and Physical, chart, labs and discussed the procedure including the risks, benefits and alternatives for the proposed anesthesia with the patient or authorized representative who has indicated his/her understanding and acceptance.       Plan Discussed with: Anesthesiologist  Anesthesia Plan Comments: (Patient identified. Risks, benefits, options discussed with patient including but not limited to bleeding, infection, nerve damage, paralysis, failed block, incomplete pain control, headache, blood pressure changes,  nausea, vomiting, reactions to medication, itching, and post partum back pain. Confirmed with bedside nurse the patient's most recent platelet count. Confirmed with the patient that they are not taking any anticoagulation, have any bleeding history or any family history of bleeding disorders. Patient expressed understanding and wishes to proceed. All questions were answered. )       Anesthesia Quick Evaluation

## 2024-04-29 NOTE — Progress Notes (Signed)
 Scottie Shaden Higley is a 28 y.o. G2P1001 at [redacted]w[redacted]d by LMP admitted for rupture of membranes  Subjective: Chief Complaint  Patient presents with   Rupture of Membranes    Objective: BP (!) 143/94   Pulse 94   Temp (!) 97.5 F (36.4 C) (Oral)   Resp 16   Ht 5' (1.524 m)   Wt 80.1 kg   LMP 07/18/2023   SpO2 99%   BMI 34.47 kg/m  No intake/output data recorded. No intake/output data recorded.  FHT:  FHR: 125 bpm, variability: moderate,  accelerations:  Abscent,  decelerations:  Present early UC:   regular, every 2-3 minutes SVE:   10 cm dilated, 100% effaced, + station   Labs: Lab Results  Component Value Date   WBC 10.4 04/29/2024   HGB 11.5 (L) 04/29/2024   HCT 35.2 (L) 04/29/2024   MCV 89.6 04/29/2024   PLT 244 04/29/2024    Assessment / Plan: complete Previous LTCS Postdates P) start pushing   Anticipated MOD:  NSVD  Jacorian Golaszewski A Jilleen Essner 04/29/2024, 10:22 PM

## 2024-04-29 NOTE — Progress Notes (Signed)
 S; c/o painful ctx  Awaiting epidural  O: BP (!) 141/87   Pulse 84   Temp 97.8 F (36.6 C) (Oral)   Resp 16   Ht 5' (1.524 m)   Wt 80.1 kg   LMP 07/18/2023   SpO2 100%   BMI 34.47 kg/m  VE deferred Pitocin  12 miu   Tracing: baseline 140  (+) accels some early decel, short variables  Ctx q 2-4 mins  Results for orders placed or performed during the hospital encounter of 04/29/24 (from the past 24 hours)  Type and screen [facility]     Status: None   Collection Time: 04/29/24  8:45 AM  Result Value Ref Range   ABO/RH(D) O POS    Antibody Screen NEG    Sample Expiration      05/02/2024,2359 Performed at [facility], [address] [place], [place] [address]   CBC     Status: Abnormal   Collection Time: 04/29/24  8:52 AM  Result Value Ref Range   WBC 10.4 4.0 - 10.5 K/uL   RBC 3.93 3.87 - 5.11 MIL/uL   Hemoglobin 11.5 (L) 12.0 - 15.0 g/dL   HCT 64.7 (L) 63.9 - 53.9 %   MCV 89.6 80.0 - 100.0 fL   MCH 29.3 26.0 - 34.0 pg   MCHC 32.7 30.0 - 36.0 g/dL   RDW 85.7 88.4 - 84.4 %   Platelets 244 150 - 400 K/uL   nRBC 0.0 0.0 - 0.2 %  RPR     Status: None   Collection Time: 04/29/24  8:52 AM  Result Value Ref Range   RPR Ser Ql NON REACTIVE NON REACTIVE  Comprehensive metabolic panel     Status: Abnormal   Collection Time: 04/29/24  8:53 AM  Result Value Ref Range   Sodium 135 135 - 145 mmol/L   Potassium 3.6 3.5 - 5.1 mmol/L   Chloride 106 98 - 111 mmol/L   CO2 20 (L) 22 - 32 mmol/L   Glucose, Bld 94 70 - 99 mg/dL   BUN <5 (L) 6 - 20 mg/dL   Creatinine, Ser 9.42 0.44 - 1.00 mg/dL   Calcium 9.2 8.9 - 89.6 mg/dL   Total Protein 6.6 6.5 - 8.1 g/dL   Albumin 2.9 (L) 3.5 - 5.0 g/dL   AST 25 15 - 41 U/L   ALT 15 0 - 44 U/L   Alkaline Phosphatase 292 (H) 38 - 126 U/L   Total Bilirubin 0.9 0.0 - 1.2 mg/dL   GFR, Estimated >39 >39 mL/min   Anion gap 9 5 - 15  Protein / creatinine ratio, urine     Status: None   Collection Time: 04/29/24 12:05  PM  Result Value Ref Range   Creatinine, Urine 90 mg/dL   Total Protein, Urine 8 mg/dL   Protein Creatinine Ratio 0.09 0.00 - 0.15 mg/mg[Cre]   IMP; Latent phase Previous C/S.  Plans VBAC Post dates P) continue pitocin .

## 2024-04-29 NOTE — MAU Note (Signed)
 Kristen Reese is a 28 y.o. at [redacted]w[redacted]d here in MAU reporting: her water  broke this morning @ 0740, clear fluid.  Denies VB.  Endorses +FM.    LMP: NA Onset of complaint: today Pain score: 5 Vitals:   04/29/24 0814  BP: 128/88  Pulse: 76  Resp: 18  Temp: 98.3 F (36.8 C)  SpO2: 100%     FHT: 140 bpm  Lab orders placed from triage: None

## 2024-04-30 ENCOUNTER — Inpatient Hospital Stay (HOSPITAL_COMMUNITY)

## 2024-04-30 ENCOUNTER — Encounter (HOSPITAL_COMMUNITY): Payer: Self-pay | Admitting: Obstetrics and Gynecology

## 2024-04-30 ENCOUNTER — Inpatient Hospital Stay (HOSPITAL_COMMUNITY): Admission: RE | Admit: 2024-04-30 | Source: Home / Self Care | Admitting: Obstetrics and Gynecology

## 2024-04-30 DIAGNOSIS — O34219 Maternal care for unspecified type scar from previous cesarean delivery: Principal | ICD-10-CM | POA: Diagnosis present

## 2024-04-30 LAB — CBC
HCT: 29.1 % — ABNORMAL LOW (ref 36.0–46.0)
Hemoglobin: 9.6 g/dL — ABNORMAL LOW (ref 12.0–15.0)
MCH: 29.4 pg (ref 26.0–34.0)
MCHC: 33 g/dL (ref 30.0–36.0)
MCV: 89 fL (ref 80.0–100.0)
Platelets: 217 10*3/uL (ref 150–400)
RBC: 3.27 MIL/uL — ABNORMAL LOW (ref 3.87–5.11)
RDW: 14 % (ref 11.5–15.5)
WBC: 21.2 10*3/uL — ABNORMAL HIGH (ref 4.0–10.5)
nRBC: 0 % (ref 0.0–0.2)

## 2024-04-30 LAB — COMPREHENSIVE METABOLIC PANEL WITH GFR
ALT: 17 U/L (ref 0–44)
AST: 27 U/L (ref 15–41)
Albumin: 2.3 g/dL — ABNORMAL LOW (ref 3.5–5.0)
Alkaline Phosphatase: 219 U/L — ABNORMAL HIGH (ref 38–126)
Anion gap: 11 (ref 5–15)
BUN: <5 mg/dL — ABNORMAL LOW (ref 6–20)
CO2: 21 mmol/L — ABNORMAL LOW (ref 22–32)
Calcium: 8.9 mg/dL (ref 8.9–10.3)
Chloride: 104 mmol/L (ref 98–111)
Creatinine, Ser: 0.56 mg/dL (ref 0.44–1.00)
GFR, Estimated: >60 mL/min (ref >60)
Glucose, Bld: 91 mg/dL (ref 70–99)
Potassium: 3.4 mmol/L — ABNORMAL LOW (ref 3.5–5.1)
Sodium: 136 mmol/L (ref 135–145)
Total Bilirubin: 1.3 mg/dL — ABNORMAL HIGH (ref 0.0–1.2)
Total Protein: 5.3 g/dL — ABNORMAL LOW (ref 6.5–8.1)

## 2024-04-30 LAB — TYPE AND SCREEN: Antibody Screen: NEGATIVE

## 2024-04-30 MED ORDER — WITCH HAZEL-GLYCERIN EX PADS: 1.0000 | MEDICATED_PAD | CUTANEOUS | Status: DC | PRN

## 2024-04-30 MED ORDER — DIPHENHYDRAMINE HCL 25 MG PO CAPS: 25.0000 mg | ORAL_CAPSULE | Freq: Four times a day (QID) | ORAL | Status: DC | PRN

## 2024-04-30 MED ORDER — SODIUM CHLORIDE 0.9 % IV SOLN: 250.0000 mL | INTRAVENOUS | Status: AC | PRN

## 2024-04-30 MED ORDER — ONDANSETRON HCL 4 MG/2ML IJ SOLN: 4.0000 mg | INTRAMUSCULAR | Status: DC | PRN

## 2024-04-30 MED ORDER — SODIUM CHLORIDE 0.9% FLUSH: 3.0000 mL | INTRAVENOUS | Status: DC | PRN

## 2024-04-30 MED ORDER — DIBUCAINE (PERIANAL) 1 % EX OINT: 1.0000 | TOPICAL_OINTMENT | CUTANEOUS | Status: DC | PRN

## 2024-04-30 MED ORDER — SENNOSIDES-DOCUSATE SODIUM 8.6-50 MG PO TABS
2.0000 | ORAL_TABLET | Freq: Every day | ORAL | Status: DC
  Administered 2024-04-30 – 2024-05-01 (×2): 2 via ORAL
  Filled 2024-04-30 (×2): qty 2

## 2024-04-30 MED ORDER — COCONUT OIL OIL: 1.0000 | TOPICAL_OIL | Status: DC | PRN

## 2024-04-30 MED ORDER — FERROUS SULFATE 325 (65 FE) MG PO TABS
325.0000 mg | ORAL_TABLET | Freq: Two times a day (BID) | ORAL | Status: DC
  Administered 2024-04-30 – 2024-05-01 (×3): 325 mg via ORAL
  Filled 2024-04-30 (×3): qty 1

## 2024-04-30 MED ORDER — ONDANSETRON HCL 4 MG PO TABS: 4.0000 mg | ORAL_TABLET | ORAL | Status: DC | PRN

## 2024-04-30 MED ORDER — BENZOCAINE-MENTHOL 20-0.5 % EX AERO
1.0000 | INHALATION_SPRAY | CUTANEOUS | Status: DC | PRN
  Filled 2024-04-30: qty 56

## 2024-04-30 MED ORDER — FUROSEMIDE 20 MG PO TABS
40.0000 mg | ORAL_TABLET | Freq: Every day | ORAL | Status: DC
  Administered 2024-04-30 – 2024-05-01 (×2): 40 mg via ORAL
  Filled 2024-04-30 (×2): qty 2

## 2024-04-30 MED ORDER — SODIUM CHLORIDE 0.9% FLUSH: 3.0000 mL | Freq: Two times a day (BID) | INTRAVENOUS | Status: DC

## 2024-04-30 MED ORDER — SIMETHICONE 80 MG PO CHEW: 80.0000 mg | CHEWABLE_TABLET | ORAL | Status: DC | PRN

## 2024-04-30 MED ORDER — IBUPROFEN 600 MG PO TABS
600.0000 mg | ORAL_TABLET | Freq: Four times a day (QID) | ORAL | Status: DC
  Administered 2024-04-30 – 2024-05-01 (×6): 600 mg via ORAL
  Filled 2024-04-30 (×6): qty 1

## 2024-04-30 MED ORDER — ACETAMINOPHEN 325 MG PO TABS
650.0000 mg | ORAL_TABLET | ORAL | Status: DC | PRN
  Administered 2024-04-30: 650 mg via ORAL
  Filled 2024-04-30: qty 2

## 2024-04-30 MED ORDER — ZOLPIDEM TARTRATE 5 MG PO TABS: 5.0000 mg | ORAL_TABLET | Freq: Every evening | ORAL | Status: DC | PRN

## 2024-04-30 MED ORDER — PRENATAL MULTIVITAMIN CH
1.0000 | ORAL_TABLET | Freq: Every day | ORAL | Status: DC
  Administered 2024-04-30 – 2024-05-01 (×2): 1 via ORAL
  Filled 2024-04-30 (×2): qty 1

## 2024-04-30 NOTE — Lactation Note (Signed)
 This note was copied from a baby's chart. Lactation Consultation Note  Patient Name: Kristen Reese Date: 04/30/2024 Age:28 hours Reason for consult: Initial assessment;Term,VBAC delivery, infant weight loss -0.15%  MOB latched infant on her right breast with pillow support using the cross cradle hold position, infant latched with depth and was still breastfeeding when LC left the room. MOB is experience with breastfeeding see maternal data below. MOB knows that if infant is still cuing after latching on the first breast to offer the 2nd breast during the same feeding. MOB knows to call if she needs latch assistance. LC discussed importance of maternal rest, meals and hydration. MOB was made aware of O/P services, breastfeeding support groups, community resources, and our phone # for post-discharge questions.    Maternal Data Has patient been taught Hand Expression?: Yes Does the patient have breastfeeding experience prior to this delivery?: Yes How long did the patient breastfeed?: MOB breastfeed her 1st child for 18 months who is currently 38 years old.  Feeding Mother's Current Feeding Choice: Breast Milk  LATCH Score Latch: Grasps breast easily, tongue down, lips flanged, rhythmical sucking.  Audible Swallowing: Spontaneous and intermittent  Type of Nipple: Everted at rest and after stimulation  Comfort (Breast/Nipple): Soft / non-tender  Hold (Positioning): Assistance needed to correctly position infant at breast and maintain latch.  LATCH Score: 9   Lactation Tools Discussed/Used    Interventions Interventions: Breast feeding basics reviewed;Assisted with latch;Skin to skin;Breast compression;Adjust position;Support pillows;Position options;Hand express;Education;CDC milk storage guidelines;CDC Guidelines for Breast Pump Cleaning;LC Services brochure  Discharge Pump: DEBP;Personal  Consult Status Consult Status: Follow-up Date: 04/30/24 Follow-up type:  In-patient    Kristen Reese 04/30/2024, 9:09 PM

## 2024-04-30 NOTE — Progress Notes (Signed)
 PPD1 SVD:   S:  Pt reports feeling well/ Tolerating po/ Voiding without problems/ No n/v/ Bleeding is just showered/ Pain controlled withprescription NSAID's including ibuprofen  (Motrin )  Newborn info live female     O:  A & O x 3 / VS: Blood pressure 132/86, pulse 80, temperature 97.8 F (36.6 C), temperature source Oral, resp. rate 17, height 5' (1.524 m), weight 80.1 kg, last menstrual period 07/18/2023, SpO2 100%, unknown if currently breastfeeding.  LABS:  Results for orders placed or performed during the hospital encounter of 04/29/24 (from the past 24 hours)  CBC     Status: Abnormal   Collection Time: 04/30/24  5:12 AM  Result Value Ref Range   WBC 21.2 (H) 4.0 - 10.5 K/uL   RBC 3.27 (L) 3.87 - 5.11 MIL/uL   Hemoglobin 9.6 (L) 12.0 - 15.0 g/dL   HCT 70.8 (L) 63.9 - 53.9 %   MCV 89.0 80.0 - 100.0 fL   MCH 29.4 26.0 - 34.0 pg   MCHC 33.0 30.0 - 36.0 g/dL   RDW 85.9 88.4 - 84.4 %   Platelets 217 150 - 400 K/uL   nRBC 0.0 0.0 - 0.2 %  Comprehensive metabolic panel     Status: Abnormal   Collection Time: 04/30/24  5:12 AM  Result Value Ref Range   Sodium 136 135 - 145 mmol/L   Potassium 3.4 (L) 3.5 - 5.1 mmol/L   Chloride 104 98 - 111 mmol/L   CO2 21 (L) 22 - 32 mmol/L   Glucose, Bld 91 70 - 99 mg/dL   BUN <5 (L) 6 - 20 mg/dL   Creatinine, Ser 9.43 0.44 - 1.00 mg/dL   Calcium 8.9 8.9 - 89.6 mg/dL   Total Protein 5.3 (L) 6.5 - 8.1 g/dL   Albumin 2.3 (L) 3.5 - 5.0 g/dL   AST 27 15 - 41 U/L   ALT 17 0 - 44 U/L   Alkaline Phosphatase 219 (H) 38 - 126 U/L   Total Bilirubin 1.3 (H) 0.0 - 1.2 mg/dL   GFR, Estimated >39 >39 mL/min   Anion gap 11 5 - 15    I&O: I/O last 3 completed shifts: In: -  Out: 310 [Blood:310]   No intake/output data recorded.  Lungs: chest clear, no wheezing, rales, normal symmetric air entry  Heart: regular rate and rhythm  Abdomen: uterus firm at umbilicus  Perineum: not inspected  Lochia: just showered  Extremities:no redness or tenderness in  the calves or thighs, no edema    A/P: PPD # 1/ [id number]  Doing well  Continue routine post partum orders   Anticipate d/c in am

## 2024-04-30 NOTE — Anesthesia Postprocedure Evaluation (Cosign Needed)
 Anesthesia Post Note  Patient: Medical illustrator  Procedure(s) Performed: AN AD HOC LABOR EPIDURAL     Patient location during evaluation: Mother Baby Anesthesia Type: Epidural Level of consciousness: awake and alert and oriented Pain management: pain level controlled Vital Signs Assessment: post-procedure vital signs reviewed and stable Respiratory status: spontaneous breathing and nonlabored ventilation Cardiovascular status: stable Postop Assessment: no headache, no backache and able to ambulate Anesthetic complications: no   No notable events documented.  Last Vitals:  Vitals:   04/30/24 0600 04/30/24 0743  BP: 130/84 109/85  Pulse: 83 80  Resp: 16 17  Temp: 36.9 C 36.8 C  SpO2:  100%    Last Pain:  Vitals:   04/30/24 0840  TempSrc:   PainSc: 0-No pain   Pain Goal:                   Vernell JAYSON Epps

## 2024-05-01 MED ORDER — IBUPROFEN 600 MG PO TABS: 600.0000 mg | ORAL_TABLET | Freq: Four times a day (QID) | ORAL | 11 refills | Status: AC | PRN

## 2024-05-01 MED ORDER — FUROSEMIDE 40 MG PO TABS: 40.0000 mg | ORAL_TABLET | Freq: Every day | ORAL | 0 refills | Status: AC

## 2024-05-01 NOTE — Progress Notes (Signed)
 PPD2 SVD:   S:  Pt reports feeling well/ Tolerating po/ Voiding without problems/ No n/v/ Bleeding is light/ Pain controlled withprescription NSAID's including ibuprofen  (Motrin )  Newborn info live female circ BRF   O:  A & O x 3 / VS: Blood pressure 125/89, pulse 77, temperature 98.2 F (36.8 C), temperature source Oral, resp. rate 18, height 5' (1.524 m), weight 80.1 kg, last menstrual period 07/18/2023, SpO2 100%, unknown if currently breastfeeding.  LABS: No results found for this or any previous visit (from the past 24 hours).  I&O: I/O last 3 completed shifts: In: -  Out: 310 [Blood:310]   No intake/output data recorded.  Lungs: chest clear, no wheezing, rales, normal symmetric air entry  Heart: regular rate and rhythm, S1, S2 normal, no murmur, click, rub or gallop  Abdomen: uterus firm at umbilicus soft  Perineum: not inspected  Lochia: scant  Extremities:no redness or tenderness in the calves or thighs, no edema    A/P: PPD # 2/ [id number]  Doing well  Continue routine post partum orders  D/c instructions reviewed F/u 6 wk  Plans OCP

## 2024-05-01 NOTE — Discharge Instructions (Signed)
Call if temperature greater than equal to 100.4, nothing per vagina for 4-6 weeks or severe nausea vomiting, increased incisional pain , drainage or redness in the incision site, no straining with bowel movements, showers no bath °

## 2024-05-01 NOTE — Discharge Summary (Signed)
 Postpartum Discharge Summary  Date of Service updated     Patient Name: Kristen Reese DOB: 12-29-1995 MRN: [id number]  Date of admission: 04/29/2024 Delivery date:04/29/2024 Delivering provider: Oday Ridings Date of discharge: 05/01/2024  Admitting diagnosis: labor, SROM , postdates, previous cesarean section Intrauterine pregnancy: [redacted]w[redacted]d     Secondary diagnosis:  TOLAC Additional problems: n/a    Discharge diagnosis: Term Pregnancy Delivered and previous Cesarean section. Gestational HTN                                              Post partum procedures:n/a Augmentation: Pitocin  Complications: None  Hospital course: Onset of Labor With Vaginal Delivery      28 y.o. yo [id number] at [redacted]w[redacted]d was admitted in Latent Labor, SROM  on 04/29/2024. Labor course was complicated by none. IUPC placed. Pitocin  augmentation. Terminal bradycardia Membrane Rupture Time/Date: 7:40 AM,04/29/2024  Delivery Method:VBAC, Vacuum Assisted Operative Delivery:Device used:mushroom Indication: Fetal indications Episiotomy: None Lacerations:   right anterior  Vaginal;Labial ( bilateral minora) Patient had a postpartum course complicated by  gestational htn.  She is ambulating, tolerating a regular diet, passing flatus, and urinating well. Patient is discharged home in stable condition on 05/01/24.  Newborn Data: Birth date:04/29/2024 Birth time:10:36 PM Gender:Female Living status:Living Apgars:4 ,9  Weight:3.856 kg  Magnesium Sulfate received: No BMZ received: No Rhophylac:No MMR:No T-DaP:Given prenatally Flu: No RSV Vaccine received: No Transfusion:No Immunizations administered: There is no immunization history for the selected administration types on file for this patient.  Physical exam  Vitals:   04/30/24 0743 04/30/24 1146 04/30/24 1954 05/01/24 0628  BP: 109/85 132/86 124/83 125/89  Pulse: 80  75 77  Resp: 17 17 18 18   Temp: 98.3 F (36.8 C) 97.8 F (36.6 C) (!) 97.4 F  (36.3 C) 98.2 F (36.8 C)  TempSrc: Oral Oral Oral Oral  SpO2: 100%  100%   Weight:      Height:       General: alert, cooperative, and no distress Lochia: appropriate Uterine Fundus: firm Incision: N/A DVT Evaluation: No evidence of DVT seen on physical exam. No significant calf/ankle edema. Labs: Lab Results  Component Value Date   WBC 21.2 (H) 04/30/2024   HGB 9.6 (L) 04/30/2024   HCT 29.1 (L) 04/30/2024   MCV 89.0 04/30/2024   PLT 217 04/30/2024      Latest Ref Rng & Units 04/30/2024    5:12 AM  CMP  Glucose 70 - 99 mg/dL 91   BUN 6 - 20 mg/dL <5   Creatinine 9.55 - 1.00 mg/dL 9.43   Sodium 864 - 854 mmol/L 136   Potassium 3.5 - 5.1 mmol/L 3.4   Chloride 98 - 111 mmol/L 104   CO2 22 - 32 mmol/L 21   Calcium 8.9 - 10.3 mg/dL 8.9   Total Protein 6.5 - 8.1 g/dL 5.3   Total Bilirubin 0.0 - 1.2 mg/dL 1.3   Alkaline Phos 38 - 126 U/L 219   AST 15 - 41 U/L 27   ALT 0 - 44 U/L 17    Edinburgh Score:    04/30/2024   10:42 PM  Edinburgh Postnatal Depression Scale Screening Tool  I have been able to laugh and see the funny side of things. 0  I have looked forward with enjoyment to things. 0  I have blamed myself unnecessarily when things  went wrong. 0  I have been anxious or worried for no good reason. 0  I have felt scared or panicky for no good reason. 0  Things have been getting on top of me. 0  I have been so unhappy that I have had difficulty sleeping. 0  I have felt sad or miserable. 0  I have been so unhappy that I have been crying. 0  The thought of harming myself has occurred to me. 0  Edinburgh Postnatal Depression Scale Total 0      After visit meds:  Allergies as of 05/01/2024   No Known Allergies      Medication List     STOP taking these medications    valACYclovir 500 MG tablet Commonly known as: VALTREX       TAKE these medications    Concept OB  130-92.4-1 MG Caps Take 1 tablet by mouth daily.   furosemide 40 MG tablet Commonly  known as: LASIX Take 1 tablet (40 mg total) by mouth daily for 3 days.   ibuprofen  600 MG tablet Commonly known as: ADVIL  Take 1 tablet (600 mg total) by mouth every 6 (six) hours as needed.         Discharge home in stable condition Infant Feeding: Breast Infant Disposition:home with mother Discharge instruction: per After Visit Summary and Postpartum booklet. Activity: Advance as tolerated. Pelvic rest for 6 weeks.  Diet: low salt diet Anticipated Birth Control: OCPs Postpartum Appointment:6 weeks Additional Postpartum F/U: n/a Future Appointments:No future appointments. Follow up Visit:  Follow-up Information     Rutherford Gain, MD Follow up in 6 week(s).   Specialty: Obstetrics and Gynecology Contact information: [address] [place] [address] [place] [address] [phone]                     05/01/2024 Gain DELENA Rutherford, MD

## 2024-05-01 NOTE — Lactation Note (Signed)
 This note was copied from a baby's chart. Lactation Consultation Note  Patient Name: Kristen Reese Unijb'd Date: 05/01/2024 Age:28 hours Reason for consult: Follow-up assessment;Term  P2, Mother is experienced with breastfeeding.  She breastfed her first child for 1.5 years.  Praised her for her efforts. Baby was latched in cradle hold with intermittent swallows when LC entered room. Reviewed engorgement care and monitoring voids/stools. Mother does not have any questions or concerns at this time.   Maternal Data Has patient been taught Hand Expression?: Yes  Feeding Mother's Current Feeding Choice: Breast Milk  LATCH Score Latch: Grasps breast easily, tongue down, lips flanged, rhythmical sucking. (latched upon entering)  Audible Swallowing: A few with stimulation  Type of Nipple: Everted at rest and after stimulation  Comfort (Breast/Nipple): Soft / non-tender  Hold (Positioning): No assistance needed to correctly position infant at breast.  LATCH Score: 9  Interventions Interventions: Breast feeding basics reviewed;Education  Discharge Discharge Education: Engorgement and breast care;Warning signs for feeding baby Pump: Personal;DEBP  Consult Status Consult Status: Complete Date: 05/01/24  Shannon Levorn Lemme  RN, IBCLC 05/01/2024, 11:25 AM

## 2024-05-01 NOTE — Plan of Care (Signed)
   Problem: Clinical Measurements: Goal: Ability to maintain clinical measurements within normal limits will improve Outcome: Completed/Met Goal: Will remain free from infection Outcome: Completed/Met Goal: Diagnostic test results will improve Outcome: Completed/Met Goal: Respiratory complications will improve Outcome: Completed/Met Goal: Cardiovascular complication will be avoided Outcome: Completed/Met

## 2024-05-11 ENCOUNTER — Telehealth (HOSPITAL_COMMUNITY): Payer: Self-pay | Admitting: *Deleted

## 2024-05-11 NOTE — Telephone Encounter (Signed)
 05/11/2024  Name: Kristen Reese MRN: [id number] DOB: 1996-03-04  Reason for Call:  Transition of Care Hospital Discharge Call  Contact Status: Patient Contact Status: Message  Language assistant needed:          Follow-Up Questions:    Van Postnatal Depression Scale:  In the Past 7 Days:    PHQ2-9 Depression Scale:     Discharge Follow-up:    Post-discharge interventions: NA  Mliss Sieve, RN 05/11/2024 11:20
